# Patient Record
Sex: Female | Born: 1965
Health system: Southern US, Community
[De-identification: ages and names within clinical notes are randomized; demographics above are authoritative.]

## PROBLEM LIST (undated history)

## (undated) DIAGNOSIS — J189 Pneumonia, unspecified organism: Secondary | ICD-10-CM

## (undated) DIAGNOSIS — K219 Gastro-esophageal reflux disease without esophagitis: Secondary | ICD-10-CM

## (undated) DIAGNOSIS — I10 Essential (primary) hypertension: Secondary | ICD-10-CM

## (undated) DIAGNOSIS — E119 Type 2 diabetes mellitus without complications: Secondary | ICD-10-CM

## (undated) DIAGNOSIS — M1990 Unspecified osteoarthritis, unspecified site: Secondary | ICD-10-CM

## (undated) HISTORY — PX: ABDOMINAL HYSTERECTOMY: SHX81

## (undated) HISTORY — PX: TUBAL LIGATION: SHX77

## (undated) HISTORY — DX: Essential (primary) hypertension: I10

## (undated) HISTORY — PX: OTHER SURGICAL HISTORY: SHX169

---

## 2002-02-14 ENCOUNTER — Encounter: Payer: Self-pay | Admitting: Family Medicine

## 2002-02-14 ENCOUNTER — Ambulatory Visit: Admission: RE | Admit: 2002-02-14 | Discharge: 2002-02-15 | Payer: Self-pay | Admitting: Family Medicine

## 2002-06-18 ENCOUNTER — Other Ambulatory Visit: Admission: RE | Admit: 2002-06-18 | Discharge: 2002-06-18 | Payer: Self-pay | Admitting: Obstetrics and Gynecology

## 2003-08-24 ENCOUNTER — Other Ambulatory Visit: Admission: RE | Admit: 2003-08-24 | Discharge: 2003-08-24 | Payer: Self-pay | Admitting: Obstetrics and Gynecology

## 2003-11-17 ENCOUNTER — Ambulatory Visit (HOSPITAL_COMMUNITY): Admission: RE | Admit: 2003-11-17 | Discharge: 2003-11-17 | Payer: Self-pay | Admitting: Orthopedic Surgery

## 2003-11-17 ENCOUNTER — Ambulatory Visit (HOSPITAL_BASED_OUTPATIENT_CLINIC_OR_DEPARTMENT_OTHER): Admission: RE | Admit: 2003-11-17 | Discharge: 2003-11-17 | Payer: Self-pay | Admitting: Orthopedic Surgery

## 2005-01-10 ENCOUNTER — Other Ambulatory Visit: Admission: RE | Admit: 2005-01-10 | Discharge: 2005-01-10 | Payer: Self-pay | Admitting: Obstetrics and Gynecology

## 2005-03-09 ENCOUNTER — Ambulatory Visit (HOSPITAL_COMMUNITY): Admission: RE | Admit: 2005-03-09 | Discharge: 2005-03-09 | Payer: Self-pay | Admitting: Obstetrics and Gynecology

## 2014-09-27 ENCOUNTER — Encounter: Payer: Self-pay | Admitting: Podiatry

## 2014-09-27 ENCOUNTER — Ambulatory Visit (INDEPENDENT_AMBULATORY_CARE_PROVIDER_SITE_OTHER): Payer: Managed Care, Other (non HMO) | Admitting: Podiatry

## 2014-09-27 VITALS — BP 176/94 | HR 67 | Resp 14 | Ht 65.0 in | Wt 290.0 lb

## 2014-09-27 DIAGNOSIS — L600 Ingrowing nail: Secondary | ICD-10-CM

## 2014-09-27 NOTE — Progress Notes (Signed)
   Subjective:    Patient ID: Michelle Perry, female    DOB: [birth year], 48 y.o.   MRN: [id number]  HPI Comments: N ingrown toenail L left 1st medial toenail border D 2 months O 2nd episode C painful and encurvated A enclosed shoes and pressure T hx of TFC treatment many years ago     Review of Systems  All other systems reviewed and are negative.      Objective:   Physical Exam  Orientated 3  Vascular: DP pulses 2/4 bilaterally PT pulses 2/4 bilaterally Capillary reflex immediate bilaterally  Neurological: Ankle reflex equal and reactive bilaterally Sensation to 10 g monofilament wire intact 5/5 bilaterally  Dermatological: The medial margin of left hallux nail is incurvated with a callused nail groove and tender to palpation  Musculoskeletal HAV deformities bilaterally       Assessment & Plan:   Assessment: Satisfactory neurovascular status Ingrowing medial margin of the left hallux toenail  Plan: Offered patient permanent nail surgery and she verbally consents. The left hallux was blocked with 3 mL of 50-50 mixture of 2% plain Xylocaine and 0.5% plain Marcaine. Left hallux was painted with Betadine exsanguinated. The medial margin of left hallux toenail was excised and a phenol matricectomy foreign. An antibiotic compression dressing was applied. The tourniquet was released and spontaneous Late filling time noted in the left hallux.  Patient tolerated procedure without any difficulty. Postoperative oral and written instructions provided. Over-the-counter acetaminophen recommended for pain control if needed.  Reappoint at patient's request

## 2014-09-27 NOTE — Patient Instructions (Signed)
  Okay to use Tylenol for pain control if needed  ANTIBACTERIAL SOAP INSTRUCTIONS  THE DAY AFTER PROCEDURE  Please follow the instructions your doctor has marked.   Shower as usual. Before getting out, place a drop of antibacterial liquid soap (Dial) on a wet, clean washcloth.  Gently wipe washcloth over affected area.  Afterward, rinse the area with warm water.  Blot the area dry with a soft cloth and cover with antibiotic ointment (neosporin, polysporin, bacitracin) and band aid or gauze and tape  Place 3-4 drops of antibacterial liquid soap in a quart of warm tap water.  Submerge foot into water for 20 minutes.  If bandage was applied after your procedure, leave on to allow for easy lift off, then remove and continue with soak for the remaining time.  Next, blot area dry with a soft cloth and cover with a bandage.  Apply other medications as directed by your doctor, such as cortisporin otic solution (eardrops) or neosporin antibiotic ointment 

## 2014-09-28 ENCOUNTER — Encounter: Payer: Self-pay | Admitting: Podiatry

## 2015-05-09 ENCOUNTER — Encounter: Payer: Self-pay | Admitting: Podiatry

## 2015-05-09 ENCOUNTER — Ambulatory Visit (INDEPENDENT_AMBULATORY_CARE_PROVIDER_SITE_OTHER): Payer: BLUE CROSS/BLUE SHIELD | Admitting: Podiatry

## 2015-05-09 VITALS — BP 120/76 | HR 76 | Temp 98.5°F | Resp 15

## 2015-05-09 DIAGNOSIS — L600 Ingrowing nail: Secondary | ICD-10-CM | POA: Diagnosis not present

## 2015-05-09 NOTE — Patient Instructions (Signed)

## 2015-05-10 NOTE — Progress Notes (Signed)
Subjective:     Patient ID: Michelle Perry, female   DOB: 08/06/1966, 49 y.o.   MRN: [id number]  HPI patient points to left big toe stating that it's been hurting her for a few weeks and that there was that's no longer present but was there previously   Review of Systems     Objective:   Physical Exam Neurovascular status intact muscle strength adequate range of motion within normal limits and patient noted to have inflammation and discomfort medial border left hallux with incurvation of the nailbed noted    Assessment:     Paronychia infection left hallux medial border with ingrown toenail component that is been corrected previously but has had moderate regrowth of bed    Plan:     Reviewed condition and recommended removal of the corner be accomplished. Patient wants this done understanding surgery and risk and today I infiltrated the left hallux 60 mg Xylocaine Marcaine mixture removed the medial border exposed matrix and apply chemical phenol 3 applications 30 seconds followed by alcohol lavage and sterile dressing. Gave instructions on soaks

## 2015-10-27 ENCOUNTER — Ambulatory Visit (INDEPENDENT_AMBULATORY_CARE_PROVIDER_SITE_OTHER): Payer: BLUE CROSS/BLUE SHIELD | Admitting: Podiatry

## 2015-10-27 ENCOUNTER — Encounter: Payer: Self-pay | Admitting: Podiatry

## 2015-10-27 VITALS — BP 137/86 | HR 69 | Resp 12

## 2015-10-27 DIAGNOSIS — L600 Ingrowing nail: Secondary | ICD-10-CM

## 2015-10-27 NOTE — Patient Instructions (Signed)

## 2015-10-27 NOTE — Progress Notes (Signed)
Subjective:     Patient ID: Michelle Perry, female   DOB: 1966/04/08, 50 y.o.   MRN: [id number]  HPI patient presents with ingrown toenail the right big toe stating that it's happened several times but it is now was spicule but he gets irritated   Review of Systems     Objective:   Physical Exam Neurovascular status intact muscle strength adequate with patient having significant reduction of discomfort right hallux but lateral border shows a small incurvated spicule that's painful when pressed and makes shoe gear difficult    Assessment:     Ingrown toenail deformity right hallux with spicule formation    Plan:     Reviewed condition and recommended removal of the corner and explained surgery. At this time explained risk and the fact it still could recur she wants it done and is motivated and I infiltrated 60 [place] times like Marcaine mixture remove the corner exposed matrix and applied phenol 3 applications 30 seconds followed by alcohol lavage and sterile dressing. Instructed on soaks and reappoint

## 2015-11-02 ENCOUNTER — Telehealth: Payer: Self-pay | Admitting: *Deleted

## 2015-11-02 NOTE — Telephone Encounter (Signed)
Left message for patient at [phone] (Home #) to check to see how they were doing from their ingrown toenail procedure that was performed on Thursday, October 27, 2015. Waiting for a response.

## 2016-01-04 DIAGNOSIS — Z79899 Other long term (current) drug therapy: Secondary | ICD-10-CM | POA: Diagnosis not present

## 2016-01-04 DIAGNOSIS — Z23 Encounter for immunization: Secondary | ICD-10-CM | POA: Diagnosis not present

## 2016-01-04 DIAGNOSIS — E119 Type 2 diabetes mellitus without complications: Secondary | ICD-10-CM | POA: Diagnosis not present

## 2016-01-04 DIAGNOSIS — I10 Essential (primary) hypertension: Secondary | ICD-10-CM | POA: Diagnosis not present

## 2016-01-04 DIAGNOSIS — M7501 Adhesive capsulitis of right shoulder: Secondary | ICD-10-CM | POA: Diagnosis not present

## 2016-01-04 DIAGNOSIS — K219 Gastro-esophageal reflux disease without esophagitis: Secondary | ICD-10-CM | POA: Diagnosis not present

## 2016-01-04 DIAGNOSIS — E782 Mixed hyperlipidemia: Secondary | ICD-10-CM | POA: Diagnosis not present

## 2016-01-18 DIAGNOSIS — M75101 Unspecified rotator cuff tear or rupture of right shoulder, not specified as traumatic: Secondary | ICD-10-CM | POA: Diagnosis not present

## 2016-01-24 DIAGNOSIS — M25511 Pain in right shoulder: Secondary | ICD-10-CM | POA: Diagnosis not present

## 2016-02-08 DIAGNOSIS — R531 Weakness: Secondary | ICD-10-CM | POA: Diagnosis not present

## 2016-02-08 DIAGNOSIS — M25511 Pain in right shoulder: Secondary | ICD-10-CM | POA: Diagnosis not present

## 2016-02-08 DIAGNOSIS — M7541 Impingement syndrome of right shoulder: Secondary | ICD-10-CM | POA: Diagnosis not present

## 2016-02-10 DIAGNOSIS — M7541 Impingement syndrome of right shoulder: Secondary | ICD-10-CM | POA: Diagnosis not present

## 2016-02-10 DIAGNOSIS — M25511 Pain in right shoulder: Secondary | ICD-10-CM | POA: Diagnosis not present

## 2016-02-10 DIAGNOSIS — R531 Weakness: Secondary | ICD-10-CM | POA: Diagnosis not present

## 2016-02-13 DIAGNOSIS — M25511 Pain in right shoulder: Secondary | ICD-10-CM | POA: Diagnosis not present

## 2016-02-13 DIAGNOSIS — R531 Weakness: Secondary | ICD-10-CM | POA: Diagnosis not present

## 2016-02-13 DIAGNOSIS — M7541 Impingement syndrome of right shoulder: Secondary | ICD-10-CM | POA: Diagnosis not present

## 2016-02-15 DIAGNOSIS — M25511 Pain in right shoulder: Secondary | ICD-10-CM | POA: Diagnosis not present

## 2016-02-15 DIAGNOSIS — R531 Weakness: Secondary | ICD-10-CM | POA: Diagnosis not present

## 2016-02-15 DIAGNOSIS — M7541 Impingement syndrome of right shoulder: Secondary | ICD-10-CM | POA: Diagnosis not present

## 2016-02-17 DIAGNOSIS — M7541 Impingement syndrome of right shoulder: Secondary | ICD-10-CM | POA: Diagnosis not present

## 2016-02-17 DIAGNOSIS — M25511 Pain in right shoulder: Secondary | ICD-10-CM | POA: Diagnosis not present

## 2016-02-17 DIAGNOSIS — R531 Weakness: Secondary | ICD-10-CM | POA: Diagnosis not present

## 2016-02-20 DIAGNOSIS — M25511 Pain in right shoulder: Secondary | ICD-10-CM | POA: Diagnosis not present

## 2016-02-20 DIAGNOSIS — R531 Weakness: Secondary | ICD-10-CM | POA: Diagnosis not present

## 2016-02-20 DIAGNOSIS — M7541 Impingement syndrome of right shoulder: Secondary | ICD-10-CM | POA: Diagnosis not present

## 2016-02-22 DIAGNOSIS — M7541 Impingement syndrome of right shoulder: Secondary | ICD-10-CM | POA: Diagnosis not present

## 2016-02-22 DIAGNOSIS — M25511 Pain in right shoulder: Secondary | ICD-10-CM | POA: Diagnosis not present

## 2016-02-22 DIAGNOSIS — R531 Weakness: Secondary | ICD-10-CM | POA: Diagnosis not present

## 2016-02-29 DIAGNOSIS — R531 Weakness: Secondary | ICD-10-CM | POA: Diagnosis not present

## 2016-02-29 DIAGNOSIS — M25511 Pain in right shoulder: Secondary | ICD-10-CM | POA: Diagnosis not present

## 2016-02-29 DIAGNOSIS — M7541 Impingement syndrome of right shoulder: Secondary | ICD-10-CM | POA: Diagnosis not present

## 2016-03-02 DIAGNOSIS — R531 Weakness: Secondary | ICD-10-CM | POA: Diagnosis not present

## 2016-03-02 DIAGNOSIS — M7541 Impingement syndrome of right shoulder: Secondary | ICD-10-CM | POA: Diagnosis not present

## 2016-03-02 DIAGNOSIS — M25511 Pain in right shoulder: Secondary | ICD-10-CM | POA: Diagnosis not present

## 2016-03-05 DIAGNOSIS — M7541 Impingement syndrome of right shoulder: Secondary | ICD-10-CM | POA: Diagnosis not present

## 2016-03-05 DIAGNOSIS — M25511 Pain in right shoulder: Secondary | ICD-10-CM | POA: Diagnosis not present

## 2016-03-05 DIAGNOSIS — R531 Weakness: Secondary | ICD-10-CM | POA: Diagnosis not present

## 2016-03-07 DIAGNOSIS — M25511 Pain in right shoulder: Secondary | ICD-10-CM | POA: Diagnosis not present

## 2016-03-07 DIAGNOSIS — M7541 Impingement syndrome of right shoulder: Secondary | ICD-10-CM | POA: Diagnosis not present

## 2016-03-07 DIAGNOSIS — R531 Weakness: Secondary | ICD-10-CM | POA: Diagnosis not present

## 2016-03-09 DIAGNOSIS — M7541 Impingement syndrome of right shoulder: Secondary | ICD-10-CM | POA: Diagnosis not present

## 2016-03-12 DIAGNOSIS — R531 Weakness: Secondary | ICD-10-CM | POA: Diagnosis not present

## 2016-03-12 DIAGNOSIS — M25511 Pain in right shoulder: Secondary | ICD-10-CM | POA: Diagnosis not present

## 2016-03-12 DIAGNOSIS — M7541 Impingement syndrome of right shoulder: Secondary | ICD-10-CM | POA: Diagnosis not present

## 2016-03-14 DIAGNOSIS — M25511 Pain in right shoulder: Secondary | ICD-10-CM | POA: Diagnosis not present

## 2016-03-14 DIAGNOSIS — R531 Weakness: Secondary | ICD-10-CM | POA: Diagnosis not present

## 2016-03-14 DIAGNOSIS — M7541 Impingement syndrome of right shoulder: Secondary | ICD-10-CM | POA: Diagnosis not present

## 2016-03-16 DIAGNOSIS — M7541 Impingement syndrome of right shoulder: Secondary | ICD-10-CM | POA: Diagnosis not present

## 2016-03-16 DIAGNOSIS — M25511 Pain in right shoulder: Secondary | ICD-10-CM | POA: Diagnosis not present

## 2016-03-16 DIAGNOSIS — R531 Weakness: Secondary | ICD-10-CM | POA: Diagnosis not present

## 2016-03-19 DIAGNOSIS — M7541 Impingement syndrome of right shoulder: Secondary | ICD-10-CM | POA: Diagnosis not present

## 2016-03-19 DIAGNOSIS — M25511 Pain in right shoulder: Secondary | ICD-10-CM | POA: Diagnosis not present

## 2016-03-19 DIAGNOSIS — R531 Weakness: Secondary | ICD-10-CM | POA: Diagnosis not present

## 2016-03-21 DIAGNOSIS — M7541 Impingement syndrome of right shoulder: Secondary | ICD-10-CM | POA: Diagnosis not present

## 2016-03-21 DIAGNOSIS — M25511 Pain in right shoulder: Secondary | ICD-10-CM | POA: Diagnosis not present

## 2016-03-21 DIAGNOSIS — R531 Weakness: Secondary | ICD-10-CM | POA: Diagnosis not present

## 2016-03-23 DIAGNOSIS — M7541 Impingement syndrome of right shoulder: Secondary | ICD-10-CM | POA: Diagnosis not present

## 2016-03-23 DIAGNOSIS — R531 Weakness: Secondary | ICD-10-CM | POA: Diagnosis not present

## 2016-03-23 DIAGNOSIS — M25511 Pain in right shoulder: Secondary | ICD-10-CM | POA: Diagnosis not present

## 2016-03-26 DIAGNOSIS — M25511 Pain in right shoulder: Secondary | ICD-10-CM | POA: Diagnosis not present

## 2016-03-26 DIAGNOSIS — R531 Weakness: Secondary | ICD-10-CM | POA: Diagnosis not present

## 2016-03-26 DIAGNOSIS — M7541 Impingement syndrome of right shoulder: Secondary | ICD-10-CM | POA: Diagnosis not present

## 2016-03-28 DIAGNOSIS — R531 Weakness: Secondary | ICD-10-CM | POA: Diagnosis not present

## 2016-03-28 DIAGNOSIS — M7541 Impingement syndrome of right shoulder: Secondary | ICD-10-CM | POA: Diagnosis not present

## 2016-03-28 DIAGNOSIS — M25511 Pain in right shoulder: Secondary | ICD-10-CM | POA: Diagnosis not present

## 2016-03-30 DIAGNOSIS — M7541 Impingement syndrome of right shoulder: Secondary | ICD-10-CM | POA: Diagnosis not present

## 2016-03-30 DIAGNOSIS — M25511 Pain in right shoulder: Secondary | ICD-10-CM | POA: Diagnosis not present

## 2016-03-30 DIAGNOSIS — R531 Weakness: Secondary | ICD-10-CM | POA: Diagnosis not present

## 2016-04-02 DIAGNOSIS — R531 Weakness: Secondary | ICD-10-CM | POA: Diagnosis not present

## 2016-04-02 DIAGNOSIS — M25511 Pain in right shoulder: Secondary | ICD-10-CM | POA: Diagnosis not present

## 2016-04-02 DIAGNOSIS — M7541 Impingement syndrome of right shoulder: Secondary | ICD-10-CM | POA: Diagnosis not present

## 2016-04-04 DIAGNOSIS — M25511 Pain in right shoulder: Secondary | ICD-10-CM | POA: Diagnosis not present

## 2016-04-04 DIAGNOSIS — M7541 Impingement syndrome of right shoulder: Secondary | ICD-10-CM | POA: Diagnosis not present

## 2016-04-04 DIAGNOSIS — R531 Weakness: Secondary | ICD-10-CM | POA: Diagnosis not present

## 2016-04-09 DIAGNOSIS — M7541 Impingement syndrome of right shoulder: Secondary | ICD-10-CM | POA: Diagnosis not present

## 2016-04-09 DIAGNOSIS — R531 Weakness: Secondary | ICD-10-CM | POA: Diagnosis not present

## 2016-04-09 DIAGNOSIS — M25511 Pain in right shoulder: Secondary | ICD-10-CM | POA: Diagnosis not present

## 2016-05-02 DIAGNOSIS — M75111 Incomplete rotator cuff tear or rupture of right shoulder, not specified as traumatic: Secondary | ICD-10-CM | POA: Diagnosis not present

## 2016-05-02 DIAGNOSIS — M19011 Primary osteoarthritis, right shoulder: Secondary | ICD-10-CM | POA: Diagnosis not present

## 2016-05-02 DIAGNOSIS — M24111 Other articular cartilage disorders, right shoulder: Secondary | ICD-10-CM | POA: Diagnosis not present

## 2016-05-02 DIAGNOSIS — M7541 Impingement syndrome of right shoulder: Secondary | ICD-10-CM | POA: Diagnosis not present

## 2016-05-02 DIAGNOSIS — M7551 Bursitis of right shoulder: Secondary | ICD-10-CM | POA: Diagnosis not present

## 2016-05-02 DIAGNOSIS — G8918 Other acute postprocedural pain: Secondary | ICD-10-CM | POA: Diagnosis not present

## 2016-05-07 DIAGNOSIS — R531 Weakness: Secondary | ICD-10-CM | POA: Diagnosis not present

## 2016-05-07 DIAGNOSIS — M25611 Stiffness of right shoulder, not elsewhere classified: Secondary | ICD-10-CM | POA: Diagnosis not present

## 2016-05-07 DIAGNOSIS — M25511 Pain in right shoulder: Secondary | ICD-10-CM | POA: Diagnosis not present

## 2016-05-07 DIAGNOSIS — M75111 Incomplete rotator cuff tear or rupture of right shoulder, not specified as traumatic: Secondary | ICD-10-CM | POA: Diagnosis not present

## 2016-05-08 DIAGNOSIS — M25511 Pain in right shoulder: Secondary | ICD-10-CM | POA: Diagnosis not present

## 2016-05-09 DIAGNOSIS — E119 Type 2 diabetes mellitus without complications: Secondary | ICD-10-CM | POA: Diagnosis not present

## 2016-05-09 DIAGNOSIS — E782 Mixed hyperlipidemia: Secondary | ICD-10-CM | POA: Diagnosis not present

## 2016-05-09 DIAGNOSIS — K219 Gastro-esophageal reflux disease without esophagitis: Secondary | ICD-10-CM | POA: Diagnosis not present

## 2016-05-09 DIAGNOSIS — I10 Essential (primary) hypertension: Secondary | ICD-10-CM | POA: Diagnosis not present

## 2016-05-11 DIAGNOSIS — M75111 Incomplete rotator cuff tear or rupture of right shoulder, not specified as traumatic: Secondary | ICD-10-CM | POA: Diagnosis not present

## 2016-05-11 DIAGNOSIS — R531 Weakness: Secondary | ICD-10-CM | POA: Diagnosis not present

## 2016-05-11 DIAGNOSIS — M25611 Stiffness of right shoulder, not elsewhere classified: Secondary | ICD-10-CM | POA: Diagnosis not present

## 2016-05-11 DIAGNOSIS — M25511 Pain in right shoulder: Secondary | ICD-10-CM | POA: Diagnosis not present

## 2016-05-14 DIAGNOSIS — M25611 Stiffness of right shoulder, not elsewhere classified: Secondary | ICD-10-CM | POA: Diagnosis not present

## 2016-05-14 DIAGNOSIS — M25511 Pain in right shoulder: Secondary | ICD-10-CM | POA: Diagnosis not present

## 2016-05-14 DIAGNOSIS — R531 Weakness: Secondary | ICD-10-CM | POA: Diagnosis not present

## 2016-05-14 DIAGNOSIS — M75111 Incomplete rotator cuff tear or rupture of right shoulder, not specified as traumatic: Secondary | ICD-10-CM | POA: Diagnosis not present

## 2016-05-17 DIAGNOSIS — R531 Weakness: Secondary | ICD-10-CM | POA: Diagnosis not present

## 2016-05-17 DIAGNOSIS — M25511 Pain in right shoulder: Secondary | ICD-10-CM | POA: Diagnosis not present

## 2016-05-17 DIAGNOSIS — M25611 Stiffness of right shoulder, not elsewhere classified: Secondary | ICD-10-CM | POA: Diagnosis not present

## 2016-05-17 DIAGNOSIS — M75111 Incomplete rotator cuff tear or rupture of right shoulder, not specified as traumatic: Secondary | ICD-10-CM | POA: Diagnosis not present

## 2016-05-21 DIAGNOSIS — J039 Acute tonsillitis, unspecified: Secondary | ICD-10-CM | POA: Diagnosis not present

## 2016-05-21 DIAGNOSIS — Z6841 Body Mass Index (BMI) 40.0 and over, adult: Secondary | ICD-10-CM | POA: Diagnosis not present

## 2016-05-25 DIAGNOSIS — M75111 Incomplete rotator cuff tear or rupture of right shoulder, not specified as traumatic: Secondary | ICD-10-CM | POA: Diagnosis not present

## 2016-05-25 DIAGNOSIS — M25511 Pain in right shoulder: Secondary | ICD-10-CM | POA: Diagnosis not present

## 2016-05-25 DIAGNOSIS — R531 Weakness: Secondary | ICD-10-CM | POA: Diagnosis not present

## 2016-05-25 DIAGNOSIS — M25611 Stiffness of right shoulder, not elsewhere classified: Secondary | ICD-10-CM | POA: Diagnosis not present

## 2016-05-28 DIAGNOSIS — M75111 Incomplete rotator cuff tear or rupture of right shoulder, not specified as traumatic: Secondary | ICD-10-CM | POA: Diagnosis not present

## 2016-05-28 DIAGNOSIS — R531 Weakness: Secondary | ICD-10-CM | POA: Diagnosis not present

## 2016-05-28 DIAGNOSIS — M25511 Pain in right shoulder: Secondary | ICD-10-CM | POA: Diagnosis not present

## 2016-05-28 DIAGNOSIS — M25611 Stiffness of right shoulder, not elsewhere classified: Secondary | ICD-10-CM | POA: Diagnosis not present

## 2016-05-30 DIAGNOSIS — R531 Weakness: Secondary | ICD-10-CM | POA: Diagnosis not present

## 2016-05-30 DIAGNOSIS — M75111 Incomplete rotator cuff tear or rupture of right shoulder, not specified as traumatic: Secondary | ICD-10-CM | POA: Diagnosis not present

## 2016-05-30 DIAGNOSIS — M25511 Pain in right shoulder: Secondary | ICD-10-CM | POA: Diagnosis not present

## 2016-05-30 DIAGNOSIS — M25611 Stiffness of right shoulder, not elsewhere classified: Secondary | ICD-10-CM | POA: Diagnosis not present

## 2016-06-05 DIAGNOSIS — M25611 Stiffness of right shoulder, not elsewhere classified: Secondary | ICD-10-CM | POA: Diagnosis not present

## 2016-06-05 DIAGNOSIS — M25511 Pain in right shoulder: Secondary | ICD-10-CM | POA: Diagnosis not present

## 2016-06-05 DIAGNOSIS — M7541 Impingement syndrome of right shoulder: Secondary | ICD-10-CM | POA: Diagnosis not present

## 2016-06-05 DIAGNOSIS — M75111 Incomplete rotator cuff tear or rupture of right shoulder, not specified as traumatic: Secondary | ICD-10-CM | POA: Diagnosis not present

## 2016-06-08 DIAGNOSIS — R531 Weakness: Secondary | ICD-10-CM | POA: Diagnosis not present

## 2016-06-08 DIAGNOSIS — M75111 Incomplete rotator cuff tear or rupture of right shoulder, not specified as traumatic: Secondary | ICD-10-CM | POA: Diagnosis not present

## 2016-06-08 DIAGNOSIS — M25511 Pain in right shoulder: Secondary | ICD-10-CM | POA: Diagnosis not present

## 2016-06-08 DIAGNOSIS — M25611 Stiffness of right shoulder, not elsewhere classified: Secondary | ICD-10-CM | POA: Diagnosis not present

## 2016-06-11 DIAGNOSIS — M25611 Stiffness of right shoulder, not elsewhere classified: Secondary | ICD-10-CM | POA: Diagnosis not present

## 2016-06-11 DIAGNOSIS — M75111 Incomplete rotator cuff tear or rupture of right shoulder, not specified as traumatic: Secondary | ICD-10-CM | POA: Diagnosis not present

## 2016-06-11 DIAGNOSIS — M25511 Pain in right shoulder: Secondary | ICD-10-CM | POA: Diagnosis not present

## 2016-06-11 DIAGNOSIS — R531 Weakness: Secondary | ICD-10-CM | POA: Diagnosis not present

## 2016-06-12 DIAGNOSIS — Z1231 Encounter for screening mammogram for malignant neoplasm of breast: Secondary | ICD-10-CM | POA: Diagnosis not present

## 2016-06-15 DIAGNOSIS — R531 Weakness: Secondary | ICD-10-CM | POA: Diagnosis not present

## 2016-06-15 DIAGNOSIS — M25611 Stiffness of right shoulder, not elsewhere classified: Secondary | ICD-10-CM | POA: Diagnosis not present

## 2016-06-15 DIAGNOSIS — M25511 Pain in right shoulder: Secondary | ICD-10-CM | POA: Diagnosis not present

## 2016-06-15 DIAGNOSIS — M75111 Incomplete rotator cuff tear or rupture of right shoulder, not specified as traumatic: Secondary | ICD-10-CM | POA: Diagnosis not present

## 2016-06-18 DIAGNOSIS — M25511 Pain in right shoulder: Secondary | ICD-10-CM | POA: Diagnosis not present

## 2016-06-18 DIAGNOSIS — R531 Weakness: Secondary | ICD-10-CM | POA: Diagnosis not present

## 2016-06-18 DIAGNOSIS — M25611 Stiffness of right shoulder, not elsewhere classified: Secondary | ICD-10-CM | POA: Diagnosis not present

## 2016-06-18 DIAGNOSIS — M75111 Incomplete rotator cuff tear or rupture of right shoulder, not specified as traumatic: Secondary | ICD-10-CM | POA: Diagnosis not present

## 2016-06-19 DIAGNOSIS — M25511 Pain in right shoulder: Secondary | ICD-10-CM | POA: Diagnosis not present

## 2016-06-19 DIAGNOSIS — M75111 Incomplete rotator cuff tear or rupture of right shoulder, not specified as traumatic: Secondary | ICD-10-CM | POA: Diagnosis not present

## 2016-06-19 DIAGNOSIS — M25611 Stiffness of right shoulder, not elsewhere classified: Secondary | ICD-10-CM | POA: Diagnosis not present

## 2016-06-19 DIAGNOSIS — R531 Weakness: Secondary | ICD-10-CM | POA: Diagnosis not present

## 2016-06-26 DIAGNOSIS — M25611 Stiffness of right shoulder, not elsewhere classified: Secondary | ICD-10-CM | POA: Diagnosis not present

## 2016-06-26 DIAGNOSIS — R531 Weakness: Secondary | ICD-10-CM | POA: Diagnosis not present

## 2016-06-26 DIAGNOSIS — M75111 Incomplete rotator cuff tear or rupture of right shoulder, not specified as traumatic: Secondary | ICD-10-CM | POA: Diagnosis not present

## 2016-06-26 DIAGNOSIS — M25511 Pain in right shoulder: Secondary | ICD-10-CM | POA: Diagnosis not present

## 2016-06-29 DIAGNOSIS — M25611 Stiffness of right shoulder, not elsewhere classified: Secondary | ICD-10-CM | POA: Diagnosis not present

## 2016-06-29 DIAGNOSIS — R531 Weakness: Secondary | ICD-10-CM | POA: Diagnosis not present

## 2016-06-29 DIAGNOSIS — M25511 Pain in right shoulder: Secondary | ICD-10-CM | POA: Diagnosis not present

## 2016-06-29 DIAGNOSIS — M75111 Incomplete rotator cuff tear or rupture of right shoulder, not specified as traumatic: Secondary | ICD-10-CM | POA: Diagnosis not present

## 2016-07-03 DIAGNOSIS — M25611 Stiffness of right shoulder, not elsewhere classified: Secondary | ICD-10-CM | POA: Diagnosis not present

## 2016-07-03 DIAGNOSIS — M75111 Incomplete rotator cuff tear or rupture of right shoulder, not specified as traumatic: Secondary | ICD-10-CM | POA: Diagnosis not present

## 2016-07-03 DIAGNOSIS — M25511 Pain in right shoulder: Secondary | ICD-10-CM | POA: Diagnosis not present

## 2016-07-03 DIAGNOSIS — R531 Weakness: Secondary | ICD-10-CM | POA: Diagnosis not present

## 2016-07-06 DIAGNOSIS — M25511 Pain in right shoulder: Secondary | ICD-10-CM | POA: Diagnosis not present

## 2016-07-06 DIAGNOSIS — R531 Weakness: Secondary | ICD-10-CM | POA: Diagnosis not present

## 2016-07-06 DIAGNOSIS — M25611 Stiffness of right shoulder, not elsewhere classified: Secondary | ICD-10-CM | POA: Diagnosis not present

## 2016-07-06 DIAGNOSIS — M75111 Incomplete rotator cuff tear or rupture of right shoulder, not specified as traumatic: Secondary | ICD-10-CM | POA: Diagnosis not present

## 2016-07-09 DIAGNOSIS — R531 Weakness: Secondary | ICD-10-CM | POA: Diagnosis not present

## 2016-07-09 DIAGNOSIS — M25611 Stiffness of right shoulder, not elsewhere classified: Secondary | ICD-10-CM | POA: Diagnosis not present

## 2016-07-09 DIAGNOSIS — M75111 Incomplete rotator cuff tear or rupture of right shoulder, not specified as traumatic: Secondary | ICD-10-CM | POA: Diagnosis not present

## 2016-07-09 DIAGNOSIS — M25511 Pain in right shoulder: Secondary | ICD-10-CM | POA: Diagnosis not present

## 2016-07-10 DIAGNOSIS — M25611 Stiffness of right shoulder, not elsewhere classified: Secondary | ICD-10-CM | POA: Diagnosis not present

## 2016-07-10 DIAGNOSIS — R531 Weakness: Secondary | ICD-10-CM | POA: Diagnosis not present

## 2016-07-10 DIAGNOSIS — M75111 Incomplete rotator cuff tear or rupture of right shoulder, not specified as traumatic: Secondary | ICD-10-CM | POA: Diagnosis not present

## 2016-07-10 DIAGNOSIS — M25511 Pain in right shoulder: Secondary | ICD-10-CM | POA: Diagnosis not present

## 2016-07-23 DIAGNOSIS — M75111 Incomplete rotator cuff tear or rupture of right shoulder, not specified as traumatic: Secondary | ICD-10-CM | POA: Diagnosis not present

## 2016-07-23 DIAGNOSIS — M25611 Stiffness of right shoulder, not elsewhere classified: Secondary | ICD-10-CM | POA: Diagnosis not present

## 2016-07-23 DIAGNOSIS — R531 Weakness: Secondary | ICD-10-CM | POA: Diagnosis not present

## 2016-07-23 DIAGNOSIS — M25511 Pain in right shoulder: Secondary | ICD-10-CM | POA: Diagnosis not present

## 2016-07-25 DIAGNOSIS — R531 Weakness: Secondary | ICD-10-CM | POA: Diagnosis not present

## 2016-07-25 DIAGNOSIS — M75111 Incomplete rotator cuff tear or rupture of right shoulder, not specified as traumatic: Secondary | ICD-10-CM | POA: Diagnosis not present

## 2016-07-25 DIAGNOSIS — M25511 Pain in right shoulder: Secondary | ICD-10-CM | POA: Diagnosis not present

## 2016-07-25 DIAGNOSIS — M25611 Stiffness of right shoulder, not elsewhere classified: Secondary | ICD-10-CM | POA: Diagnosis not present

## 2016-07-27 DIAGNOSIS — R531 Weakness: Secondary | ICD-10-CM | POA: Diagnosis not present

## 2016-07-27 DIAGNOSIS — M25511 Pain in right shoulder: Secondary | ICD-10-CM | POA: Diagnosis not present

## 2016-07-27 DIAGNOSIS — M75111 Incomplete rotator cuff tear or rupture of right shoulder, not specified as traumatic: Secondary | ICD-10-CM | POA: Diagnosis not present

## 2016-07-27 DIAGNOSIS — M25611 Stiffness of right shoulder, not elsewhere classified: Secondary | ICD-10-CM | POA: Diagnosis not present

## 2016-07-30 DIAGNOSIS — M25611 Stiffness of right shoulder, not elsewhere classified: Secondary | ICD-10-CM | POA: Diagnosis not present

## 2016-07-30 DIAGNOSIS — M25511 Pain in right shoulder: Secondary | ICD-10-CM | POA: Diagnosis not present

## 2016-07-30 DIAGNOSIS — M75111 Incomplete rotator cuff tear or rupture of right shoulder, not specified as traumatic: Secondary | ICD-10-CM | POA: Diagnosis not present

## 2016-07-30 DIAGNOSIS — R531 Weakness: Secondary | ICD-10-CM | POA: Diagnosis not present

## 2016-08-13 DIAGNOSIS — M25511 Pain in right shoulder: Secondary | ICD-10-CM | POA: Diagnosis not present

## 2016-09-06 DIAGNOSIS — M1712 Unilateral primary osteoarthritis, left knee: Secondary | ICD-10-CM | POA: Diagnosis not present

## 2016-09-12 DIAGNOSIS — M1712 Unilateral primary osteoarthritis, left knee: Secondary | ICD-10-CM | POA: Diagnosis not present

## 2016-09-19 DIAGNOSIS — I10 Essential (primary) hypertension: Secondary | ICD-10-CM | POA: Diagnosis not present

## 2016-09-19 DIAGNOSIS — Z1389 Encounter for screening for other disorder: Secondary | ICD-10-CM | POA: Diagnosis not present

## 2016-09-19 DIAGNOSIS — Z6841 Body Mass Index (BMI) 40.0 and over, adult: Secondary | ICD-10-CM | POA: Diagnosis not present

## 2016-09-19 DIAGNOSIS — E782 Mixed hyperlipidemia: Secondary | ICD-10-CM | POA: Diagnosis not present

## 2016-09-19 DIAGNOSIS — E119 Type 2 diabetes mellitus without complications: Secondary | ICD-10-CM | POA: Diagnosis not present

## 2016-09-20 DIAGNOSIS — M1712 Unilateral primary osteoarthritis, left knee: Secondary | ICD-10-CM | POA: Diagnosis not present

## 2016-10-02 DIAGNOSIS — M1712 Unilateral primary osteoarthritis, left knee: Secondary | ICD-10-CM | POA: Diagnosis not present

## 2016-10-08 DIAGNOSIS — Z6841 Body Mass Index (BMI) 40.0 and over, adult: Secondary | ICD-10-CM | POA: Diagnosis not present

## 2016-10-08 DIAGNOSIS — J0110 Acute frontal sinusitis, unspecified: Secondary | ICD-10-CM | POA: Diagnosis not present

## 2017-01-07 DIAGNOSIS — Z6841 Body Mass Index (BMI) 40.0 and over, adult: Secondary | ICD-10-CM | POA: Diagnosis not present

## 2017-01-07 DIAGNOSIS — L989 Disorder of the skin and subcutaneous tissue, unspecified: Secondary | ICD-10-CM | POA: Diagnosis not present

## 2017-01-15 DIAGNOSIS — R222 Localized swelling, mass and lump, trunk: Secondary | ICD-10-CM | POA: Diagnosis not present

## 2017-01-15 DIAGNOSIS — L989 Disorder of the skin and subcutaneous tissue, unspecified: Secondary | ICD-10-CM | POA: Diagnosis not present

## 2017-02-15 DIAGNOSIS — J019 Acute sinusitis, unspecified: Secondary | ICD-10-CM | POA: Diagnosis not present

## 2017-02-15 DIAGNOSIS — Z6841 Body Mass Index (BMI) 40.0 and over, adult: Secondary | ICD-10-CM | POA: Diagnosis not present

## 2017-03-08 DIAGNOSIS — Z6841 Body Mass Index (BMI) 40.0 and over, adult: Secondary | ICD-10-CM | POA: Diagnosis not present

## 2017-03-08 DIAGNOSIS — E119 Type 2 diabetes mellitus without complications: Secondary | ICD-10-CM | POA: Diagnosis not present

## 2017-03-08 DIAGNOSIS — L259 Unspecified contact dermatitis, unspecified cause: Secondary | ICD-10-CM | POA: Diagnosis not present

## 2017-03-22 DIAGNOSIS — E119 Type 2 diabetes mellitus without complications: Secondary | ICD-10-CM | POA: Diagnosis not present

## 2017-03-22 DIAGNOSIS — E782 Mixed hyperlipidemia: Secondary | ICD-10-CM | POA: Diagnosis not present

## 2017-03-22 DIAGNOSIS — K219 Gastro-esophageal reflux disease without esophagitis: Secondary | ICD-10-CM | POA: Diagnosis not present

## 2017-03-22 DIAGNOSIS — I10 Essential (primary) hypertension: Secondary | ICD-10-CM | POA: Diagnosis not present

## 2017-05-30 DIAGNOSIS — R42 Dizziness and giddiness: Secondary | ICD-10-CM | POA: Diagnosis not present

## 2017-05-30 DIAGNOSIS — Z6841 Body Mass Index (BMI) 40.0 and over, adult: Secondary | ICD-10-CM | POA: Diagnosis not present

## 2017-08-12 DIAGNOSIS — H5213 Myopia, bilateral: Secondary | ICD-10-CM | POA: Diagnosis not present

## 2017-08-12 DIAGNOSIS — E119 Type 2 diabetes mellitus without complications: Secondary | ICD-10-CM | POA: Diagnosis not present

## 2017-09-11 DIAGNOSIS — M1712 Unilateral primary osteoarthritis, left knee: Secondary | ICD-10-CM | POA: Diagnosis not present

## 2017-09-18 DIAGNOSIS — M1712 Unilateral primary osteoarthritis, left knee: Secondary | ICD-10-CM | POA: Diagnosis not present

## 2017-10-02 DIAGNOSIS — M1712 Unilateral primary osteoarthritis, left knee: Secondary | ICD-10-CM | POA: Diagnosis not present

## 2017-10-03 DIAGNOSIS — E782 Mixed hyperlipidemia: Secondary | ICD-10-CM | POA: Diagnosis not present

## 2017-10-03 DIAGNOSIS — Z79899 Other long term (current) drug therapy: Secondary | ICD-10-CM | POA: Diagnosis not present

## 2017-10-03 DIAGNOSIS — I10 Essential (primary) hypertension: Secondary | ICD-10-CM | POA: Diagnosis not present

## 2017-10-03 DIAGNOSIS — M1712 Unilateral primary osteoarthritis, left knee: Secondary | ICD-10-CM | POA: Diagnosis not present

## 2017-10-03 DIAGNOSIS — E119 Type 2 diabetes mellitus without complications: Secondary | ICD-10-CM | POA: Diagnosis not present

## 2017-10-03 DIAGNOSIS — N951 Menopausal and female climacteric states: Secondary | ICD-10-CM | POA: Diagnosis not present

## 2017-10-09 DIAGNOSIS — D225 Melanocytic nevi of trunk: Secondary | ICD-10-CM | POA: Diagnosis not present

## 2017-10-09 DIAGNOSIS — D1801 Hemangioma of skin and subcutaneous tissue: Secondary | ICD-10-CM | POA: Diagnosis not present

## 2017-10-09 DIAGNOSIS — L821 Other seborrheic keratosis: Secondary | ICD-10-CM | POA: Diagnosis not present

## 2017-10-09 DIAGNOSIS — L814 Other melanin hyperpigmentation: Secondary | ICD-10-CM | POA: Diagnosis not present

## 2017-10-22 DIAGNOSIS — Z1231 Encounter for screening mammogram for malignant neoplasm of breast: Secondary | ICD-10-CM | POA: Diagnosis not present

## 2017-11-18 DIAGNOSIS — Z6841 Body Mass Index (BMI) 40.0 and over, adult: Secondary | ICD-10-CM | POA: Diagnosis not present

## 2017-11-18 DIAGNOSIS — J019 Acute sinusitis, unspecified: Secondary | ICD-10-CM | POA: Diagnosis not present

## 2017-12-11 ENCOUNTER — Ambulatory Visit: Payer: BLUE CROSS/BLUE SHIELD | Admitting: Podiatry

## 2017-12-19 ENCOUNTER — Ambulatory Visit: Payer: BLUE CROSS/BLUE SHIELD | Admitting: Podiatry

## 2018-04-28 DIAGNOSIS — I10 Essential (primary) hypertension: Secondary | ICD-10-CM | POA: Diagnosis not present

## 2018-04-28 DIAGNOSIS — K219 Gastro-esophageal reflux disease without esophagitis: Secondary | ICD-10-CM | POA: Diagnosis not present

## 2018-04-28 DIAGNOSIS — Z1331 Encounter for screening for depression: Secondary | ICD-10-CM | POA: Diagnosis not present

## 2018-04-28 DIAGNOSIS — E119 Type 2 diabetes mellitus without complications: Secondary | ICD-10-CM | POA: Diagnosis not present

## 2018-04-28 DIAGNOSIS — E782 Mixed hyperlipidemia: Secondary | ICD-10-CM | POA: Diagnosis not present

## 2018-05-26 DIAGNOSIS — J029 Acute pharyngitis, unspecified: Secondary | ICD-10-CM | POA: Diagnosis not present

## 2018-07-29 DIAGNOSIS — K219 Gastro-esophageal reflux disease without esophagitis: Secondary | ICD-10-CM | POA: Diagnosis not present

## 2018-07-29 DIAGNOSIS — I10 Essential (primary) hypertension: Secondary | ICD-10-CM | POA: Diagnosis not present

## 2018-07-29 DIAGNOSIS — E119 Type 2 diabetes mellitus without complications: Secondary | ICD-10-CM | POA: Diagnosis not present

## 2018-07-29 DIAGNOSIS — Z1331 Encounter for screening for depression: Secondary | ICD-10-CM | POA: Diagnosis not present

## 2018-07-29 DIAGNOSIS — Z23 Encounter for immunization: Secondary | ICD-10-CM | POA: Diagnosis not present

## 2018-07-29 DIAGNOSIS — M1712 Unilateral primary osteoarthritis, left knee: Secondary | ICD-10-CM | POA: Diagnosis not present

## 2018-09-01 ENCOUNTER — Ambulatory Visit (INDEPENDENT_AMBULATORY_CARE_PROVIDER_SITE_OTHER): Payer: BLUE CROSS/BLUE SHIELD | Admitting: Podiatry

## 2018-09-01 ENCOUNTER — Encounter: Payer: Self-pay | Admitting: Podiatry

## 2018-09-01 VITALS — BP 129/82 | HR 79

## 2018-09-01 DIAGNOSIS — M1712 Unilateral primary osteoarthritis, left knee: Secondary | ICD-10-CM | POA: Diagnosis not present

## 2018-09-01 DIAGNOSIS — L600 Ingrowing nail: Secondary | ICD-10-CM

## 2018-09-01 MED ORDER — NEOMYCIN-POLYMYXIN-HC 3.5-10000-1 OT SOLN: OTIC | 1 refills | Status: DC

## 2018-09-01 NOTE — Patient Instructions (Signed)

## 2018-09-07 NOTE — Progress Notes (Signed)
Subjective:   Patient ID: Michelle Perry, female   DOB: 52 y.o.   MRN: [id number]   HPI Patient presents with painful ingrown toenail deformity right hallux medial border that is increasingly hard for her to cut herself and she is tried to trim it she is tried soaking it without relief of symptoms   ROS      Objective:  Physical Exam  Neurovascular status intact with incurvated medial border right hallux that is painful when palpated making shoe gear difficult and patient has tried wider shoes with no indications of drainage or infective process     Assessment:  Chronic ingrown toenail deformity with pain right hallux medial border     Plan:  H&P condition reviewed and recommended removal of the border.  Explained procedure and risk and today I went ahead and I explained the procedure and patient signed a consent form.  Patient had the hallux infiltrated 60 mg like Marcaine mixture of medial border excised with sterile instrumentation matrix exposed and phenol 3 applications 30 seconds followed by alcohol lavage and sterile dressing was applied.  Gave instructions to soak and to leave dressing on 24 hours unless were to start to throb and then take it off earlier and gave instructions for Corticosporin otic solution which was prescribed along with reappoint 2 weeks and encouraged to call with any questions concerns signed visit

## 2018-09-08 DIAGNOSIS — M1712 Unilateral primary osteoarthritis, left knee: Secondary | ICD-10-CM | POA: Diagnosis not present

## 2018-09-15 DIAGNOSIS — M1712 Unilateral primary osteoarthritis, left knee: Secondary | ICD-10-CM | POA: Diagnosis not present

## 2018-09-26 DIAGNOSIS — Z1211 Encounter for screening for malignant neoplasm of colon: Secondary | ICD-10-CM | POA: Diagnosis not present

## 2018-10-15 DIAGNOSIS — D225 Melanocytic nevi of trunk: Secondary | ICD-10-CM | POA: Diagnosis not present

## 2018-10-15 DIAGNOSIS — L821 Other seborrheic keratosis: Secondary | ICD-10-CM | POA: Diagnosis not present

## 2018-10-15 DIAGNOSIS — L814 Other melanin hyperpigmentation: Secondary | ICD-10-CM | POA: Diagnosis not present

## 2018-10-15 DIAGNOSIS — Z411 Encounter for cosmetic surgery: Secondary | ICD-10-CM | POA: Diagnosis not present

## 2018-11-03 DIAGNOSIS — E782 Mixed hyperlipidemia: Secondary | ICD-10-CM | POA: Diagnosis not present

## 2018-11-03 DIAGNOSIS — E119 Type 2 diabetes mellitus without complications: Secondary | ICD-10-CM | POA: Diagnosis not present

## 2018-11-03 DIAGNOSIS — I10 Essential (primary) hypertension: Secondary | ICD-10-CM | POA: Diagnosis not present

## 2018-11-03 DIAGNOSIS — Z1231 Encounter for screening mammogram for malignant neoplasm of breast: Secondary | ICD-10-CM | POA: Diagnosis not present

## 2018-11-25 DIAGNOSIS — Z1231 Encounter for screening mammogram for malignant neoplasm of breast: Secondary | ICD-10-CM | POA: Diagnosis not present

## 2018-12-16 DIAGNOSIS — M25552 Pain in left hip: Secondary | ICD-10-CM | POA: Diagnosis not present

## 2018-12-16 DIAGNOSIS — M545 Low back pain: Secondary | ICD-10-CM | POA: Diagnosis not present

## 2018-12-16 DIAGNOSIS — M25551 Pain in right hip: Secondary | ICD-10-CM | POA: Diagnosis not present

## 2019-01-12 DIAGNOSIS — M4056 Lordosis, unspecified, lumbar region: Secondary | ICD-10-CM | POA: Diagnosis not present

## 2019-01-12 DIAGNOSIS — M545 Low back pain: Secondary | ICD-10-CM | POA: Diagnosis not present

## 2019-01-12 DIAGNOSIS — M2560 Stiffness of unspecified joint, not elsewhere classified: Secondary | ICD-10-CM | POA: Diagnosis not present

## 2019-01-13 DIAGNOSIS — M545 Low back pain: Secondary | ICD-10-CM | POA: Diagnosis not present

## 2019-01-14 DIAGNOSIS — M545 Low back pain: Secondary | ICD-10-CM | POA: Diagnosis not present

## 2019-01-14 DIAGNOSIS — M2560 Stiffness of unspecified joint, not elsewhere classified: Secondary | ICD-10-CM | POA: Diagnosis not present

## 2019-01-14 DIAGNOSIS — M4056 Lordosis, unspecified, lumbar region: Secondary | ICD-10-CM | POA: Diagnosis not present

## 2019-01-19 DIAGNOSIS — M545 Low back pain: Secondary | ICD-10-CM | POA: Diagnosis not present

## 2019-01-19 DIAGNOSIS — M4056 Lordosis, unspecified, lumbar region: Secondary | ICD-10-CM | POA: Diagnosis not present

## 2019-01-19 DIAGNOSIS — M2560 Stiffness of unspecified joint, not elsewhere classified: Secondary | ICD-10-CM | POA: Diagnosis not present

## 2019-01-21 DIAGNOSIS — M4056 Lordosis, unspecified, lumbar region: Secondary | ICD-10-CM | POA: Diagnosis not present

## 2019-01-21 DIAGNOSIS — M2560 Stiffness of unspecified joint, not elsewhere classified: Secondary | ICD-10-CM | POA: Diagnosis not present

## 2019-01-21 DIAGNOSIS — M545 Low back pain: Secondary | ICD-10-CM | POA: Diagnosis not present

## 2019-01-26 DIAGNOSIS — M2560 Stiffness of unspecified joint, not elsewhere classified: Secondary | ICD-10-CM | POA: Diagnosis not present

## 2019-01-26 DIAGNOSIS — M545 Low back pain: Secondary | ICD-10-CM | POA: Diagnosis not present

## 2019-01-26 DIAGNOSIS — M4056 Lordosis, unspecified, lumbar region: Secondary | ICD-10-CM | POA: Diagnosis not present

## 2019-01-28 DIAGNOSIS — M4056 Lordosis, unspecified, lumbar region: Secondary | ICD-10-CM | POA: Diagnosis not present

## 2019-01-28 DIAGNOSIS — M545 Low back pain: Secondary | ICD-10-CM | POA: Diagnosis not present

## 2019-01-28 DIAGNOSIS — M2560 Stiffness of unspecified joint, not elsewhere classified: Secondary | ICD-10-CM | POA: Diagnosis not present

## 2019-02-02 DIAGNOSIS — M2560 Stiffness of unspecified joint, not elsewhere classified: Secondary | ICD-10-CM | POA: Diagnosis not present

## 2019-02-02 DIAGNOSIS — M545 Low back pain: Secondary | ICD-10-CM | POA: Diagnosis not present

## 2019-02-02 DIAGNOSIS — M4056 Lordosis, unspecified, lumbar region: Secondary | ICD-10-CM | POA: Diagnosis not present

## 2019-02-04 DIAGNOSIS — M545 Low back pain: Secondary | ICD-10-CM | POA: Diagnosis not present

## 2019-02-04 DIAGNOSIS — M4056 Lordosis, unspecified, lumbar region: Secondary | ICD-10-CM | POA: Diagnosis not present

## 2019-02-04 DIAGNOSIS — M2560 Stiffness of unspecified joint, not elsewhere classified: Secondary | ICD-10-CM | POA: Diagnosis not present

## 2019-02-09 DIAGNOSIS — M2560 Stiffness of unspecified joint, not elsewhere classified: Secondary | ICD-10-CM | POA: Diagnosis not present

## 2019-02-09 DIAGNOSIS — M545 Low back pain: Secondary | ICD-10-CM | POA: Diagnosis not present

## 2019-02-09 DIAGNOSIS — M4056 Lordosis, unspecified, lumbar region: Secondary | ICD-10-CM | POA: Diagnosis not present

## 2019-02-11 DIAGNOSIS — M545 Low back pain: Secondary | ICD-10-CM | POA: Diagnosis not present

## 2019-02-11 DIAGNOSIS — M4056 Lordosis, unspecified, lumbar region: Secondary | ICD-10-CM | POA: Diagnosis not present

## 2019-02-11 DIAGNOSIS — M2560 Stiffness of unspecified joint, not elsewhere classified: Secondary | ICD-10-CM | POA: Diagnosis not present

## 2019-02-16 DIAGNOSIS — M545 Low back pain: Secondary | ICD-10-CM | POA: Diagnosis not present

## 2019-02-16 DIAGNOSIS — M4056 Lordosis, unspecified, lumbar region: Secondary | ICD-10-CM | POA: Diagnosis not present

## 2019-02-16 DIAGNOSIS — M2560 Stiffness of unspecified joint, not elsewhere classified: Secondary | ICD-10-CM | POA: Diagnosis not present

## 2019-02-17 DIAGNOSIS — M545 Low back pain: Secondary | ICD-10-CM | POA: Diagnosis not present

## 2019-02-17 DIAGNOSIS — M4056 Lordosis, unspecified, lumbar region: Secondary | ICD-10-CM | POA: Diagnosis not present

## 2019-02-18 DIAGNOSIS — M545 Low back pain: Secondary | ICD-10-CM | POA: Diagnosis not present

## 2019-02-18 DIAGNOSIS — M4056 Lordosis, unspecified, lumbar region: Secondary | ICD-10-CM | POA: Diagnosis not present

## 2019-02-18 DIAGNOSIS — M2560 Stiffness of unspecified joint, not elsewhere classified: Secondary | ICD-10-CM | POA: Diagnosis not present

## 2019-02-25 DIAGNOSIS — M2560 Stiffness of unspecified joint, not elsewhere classified: Secondary | ICD-10-CM | POA: Diagnosis not present

## 2019-02-25 DIAGNOSIS — M4056 Lordosis, unspecified, lumbar region: Secondary | ICD-10-CM | POA: Diagnosis not present

## 2019-02-25 DIAGNOSIS — M545 Low back pain: Secondary | ICD-10-CM | POA: Diagnosis not present

## 2019-02-27 DIAGNOSIS — M545 Low back pain: Secondary | ICD-10-CM | POA: Diagnosis not present

## 2019-02-27 DIAGNOSIS — M4056 Lordosis, unspecified, lumbar region: Secondary | ICD-10-CM | POA: Diagnosis not present

## 2019-02-27 DIAGNOSIS — M2560 Stiffness of unspecified joint, not elsewhere classified: Secondary | ICD-10-CM | POA: Diagnosis not present

## 2019-03-02 DIAGNOSIS — M2560 Stiffness of unspecified joint, not elsewhere classified: Secondary | ICD-10-CM | POA: Diagnosis not present

## 2019-03-02 DIAGNOSIS — M545 Low back pain: Secondary | ICD-10-CM | POA: Diagnosis not present

## 2019-03-02 DIAGNOSIS — M4056 Lordosis, unspecified, lumbar region: Secondary | ICD-10-CM | POA: Diagnosis not present

## 2019-03-04 DIAGNOSIS — M545 Low back pain: Secondary | ICD-10-CM | POA: Diagnosis not present

## 2019-03-04 DIAGNOSIS — M4056 Lordosis, unspecified, lumbar region: Secondary | ICD-10-CM | POA: Diagnosis not present

## 2019-03-04 DIAGNOSIS — M2560 Stiffness of unspecified joint, not elsewhere classified: Secondary | ICD-10-CM | POA: Diagnosis not present

## 2019-03-09 DIAGNOSIS — M4056 Lordosis, unspecified, lumbar region: Secondary | ICD-10-CM | POA: Diagnosis not present

## 2019-03-09 DIAGNOSIS — M2560 Stiffness of unspecified joint, not elsewhere classified: Secondary | ICD-10-CM | POA: Diagnosis not present

## 2019-03-09 DIAGNOSIS — M545 Low back pain: Secondary | ICD-10-CM | POA: Diagnosis not present

## 2019-03-11 DIAGNOSIS — M2560 Stiffness of unspecified joint, not elsewhere classified: Secondary | ICD-10-CM | POA: Diagnosis not present

## 2019-03-11 DIAGNOSIS — M545 Low back pain: Secondary | ICD-10-CM | POA: Diagnosis not present

## 2019-03-11 DIAGNOSIS — M4056 Lordosis, unspecified, lumbar region: Secondary | ICD-10-CM | POA: Diagnosis not present

## 2019-03-18 DIAGNOSIS — M4056 Lordosis, unspecified, lumbar region: Secondary | ICD-10-CM | POA: Diagnosis not present

## 2019-03-18 DIAGNOSIS — M2560 Stiffness of unspecified joint, not elsewhere classified: Secondary | ICD-10-CM | POA: Diagnosis not present

## 2019-03-18 DIAGNOSIS — M545 Low back pain: Secondary | ICD-10-CM | POA: Diagnosis not present

## 2019-03-20 DIAGNOSIS — M4056 Lordosis, unspecified, lumbar region: Secondary | ICD-10-CM | POA: Diagnosis not present

## 2019-03-20 DIAGNOSIS — M545 Low back pain: Secondary | ICD-10-CM | POA: Diagnosis not present

## 2019-03-20 DIAGNOSIS — M2560 Stiffness of unspecified joint, not elsewhere classified: Secondary | ICD-10-CM | POA: Diagnosis not present

## 2019-04-08 DIAGNOSIS — M47816 Spondylosis without myelopathy or radiculopathy, lumbar region: Secondary | ICD-10-CM | POA: Diagnosis not present

## 2019-04-08 DIAGNOSIS — M545 Low back pain: Secondary | ICD-10-CM | POA: Diagnosis not present

## 2019-05-04 DIAGNOSIS — K219 Gastro-esophageal reflux disease without esophagitis: Secondary | ICD-10-CM | POA: Diagnosis not present

## 2019-05-04 DIAGNOSIS — Z23 Encounter for immunization: Secondary | ICD-10-CM | POA: Diagnosis not present

## 2019-05-04 DIAGNOSIS — E782 Mixed hyperlipidemia: Secondary | ICD-10-CM | POA: Diagnosis not present

## 2019-05-04 DIAGNOSIS — I10 Essential (primary) hypertension: Secondary | ICD-10-CM | POA: Diagnosis not present

## 2019-05-04 DIAGNOSIS — E119 Type 2 diabetes mellitus without complications: Secondary | ICD-10-CM | POA: Diagnosis not present

## 2019-07-07 DIAGNOSIS — H5213 Myopia, bilateral: Secondary | ICD-10-CM | POA: Diagnosis not present

## 2019-07-07 DIAGNOSIS — E119 Type 2 diabetes mellitus without complications: Secondary | ICD-10-CM | POA: Diagnosis not present

## 2019-07-14 DIAGNOSIS — M1712 Unilateral primary osteoarthritis, left knee: Secondary | ICD-10-CM | POA: Diagnosis not present

## 2019-07-21 DIAGNOSIS — M1712 Unilateral primary osteoarthritis, left knee: Secondary | ICD-10-CM | POA: Diagnosis not present

## 2019-07-28 DIAGNOSIS — M1712 Unilateral primary osteoarthritis, left knee: Secondary | ICD-10-CM | POA: Diagnosis not present

## 2019-09-09 DIAGNOSIS — M47816 Spondylosis without myelopathy or radiculopathy, lumbar region: Secondary | ICD-10-CM | POA: Diagnosis not present

## 2019-09-14 ENCOUNTER — Other Ambulatory Visit: Payer: Self-pay

## 2019-09-14 ENCOUNTER — Encounter: Payer: Self-pay | Admitting: Podiatry

## 2019-09-14 ENCOUNTER — Ambulatory Visit (INDEPENDENT_AMBULATORY_CARE_PROVIDER_SITE_OTHER): Payer: BC Managed Care – PPO | Admitting: Podiatry

## 2019-09-14 DIAGNOSIS — L600 Ingrowing nail: Secondary | ICD-10-CM | POA: Diagnosis not present

## 2019-09-14 MED ORDER — NEOMYCIN-POLYMYXIN-HC 3.5-10000-1 OT SOLN: OTIC | 0 refills | Status: DC

## 2019-09-14 NOTE — Patient Instructions (Signed)

## 2019-09-16 NOTE — Progress Notes (Signed)
Subjective:   Patient ID: Michelle Perry, female   DOB: 53 y.o.   MRN: [id number]   HPI Patient presents stating she developed an ingrown toenail of the right big toe that is been sore and making it hard for her to wear shoe gear comfortably   ROS      Objective:  Physical Exam  Neurovascular status intact with patient found to have irritation of the right hallux medial border that has grown into the skin and creating pain with no redness or drainage noted      Assessment:  Ingrown toenail deformity right hallux medial border     Plan:  H&P condition reviewed and recommended correction of the nail border.  Explained procedure risk and patient signed consent form and today I infiltrated the right hallux 60 mg Xylocaine Marcaine mixture and I did go ahead and I then removed the medial border exposed matrix applied phenol 3 applications 30 seconds followed by alcohol lavage and sterile dressing.  Gave instructions on soaks and reappoint for [place] to recheck again and I advised this patient on leaving dressing on 24 hours but taken off earlier if needed and wrote for drops to use postoperatively

## 2019-10-05 ENCOUNTER — Other Ambulatory Visit: Payer: Self-pay | Admitting: Podiatry

## 2019-10-06 NOTE — Telephone Encounter (Signed)
Called pt regarding medication refill request for neomycin cream; pt had ingrown removed 09/02/2019 and says still have bad drainage, redness and tenderness, feels toe have become infected again; advised pt to make appt to see Dr before refill is given.

## 2019-10-07 NOTE — Telephone Encounter (Signed)
Thank you :)

## 2019-10-12 ENCOUNTER — Encounter: Payer: Self-pay | Admitting: Podiatry

## 2019-10-12 ENCOUNTER — Other Ambulatory Visit: Payer: Self-pay

## 2019-10-12 ENCOUNTER — Ambulatory Visit: Payer: BC Managed Care – PPO | Admitting: Podiatry

## 2019-10-12 DIAGNOSIS — L03031 Cellulitis of right toe: Secondary | ICD-10-CM

## 2019-10-12 MED ORDER — DOXYCYCLINE HYCLATE 100 MG PO TABS: 100.0000 mg | ORAL_TABLET | Freq: Two times a day (BID) | ORAL | 0 refills | Status: DC

## 2019-10-12 NOTE — Progress Notes (Signed)
Subjective:   Patient ID: Michelle Perry, female   DOB: 54 y.o.   MRN: [id number]   HPI Patient presents stating I am concerned about redness around my nail site that was corrected a month ago.  States it is been slightly draining and its been sore and I have still been using the drops and soak   ROS      Objective:  Physical Exam  Neurovascular status intact which appears to be well corrected nail site right hallux medial border with proximal keratotic lesion and tissue formation with slight redness but no active drainage noted     Assessment:  Possibility for low-grade paronychia infection right hallux medial side     Plan:  Advised her on soaks and trying to roughen the area up to reduce any scar tissue formation and placed on doxycycline for 7 days.  I discussed possibly opening the area up if symptoms persist or any changes were to occur and she is strongly encouraged to call with any changes which may occur but I am hoping with antibiotics it will heal uneventfully

## 2019-10-22 DIAGNOSIS — M9903 Segmental and somatic dysfunction of lumbar region: Secondary | ICD-10-CM | POA: Diagnosis not present

## 2019-10-22 DIAGNOSIS — M545 Low back pain: Secondary | ICD-10-CM | POA: Diagnosis not present

## 2019-10-22 DIAGNOSIS — Q7649 Other congenital malformations of spine, not associated with scoliosis: Secondary | ICD-10-CM | POA: Diagnosis not present

## 2019-10-22 DIAGNOSIS — M9905 Segmental and somatic dysfunction of pelvic region: Secondary | ICD-10-CM | POA: Diagnosis not present

## 2019-10-27 DIAGNOSIS — L814 Other melanin hyperpigmentation: Secondary | ICD-10-CM | POA: Diagnosis not present

## 2019-10-27 DIAGNOSIS — D225 Melanocytic nevi of trunk: Secondary | ICD-10-CM | POA: Diagnosis not present

## 2019-10-27 DIAGNOSIS — Z23 Encounter for immunization: Secondary | ICD-10-CM | POA: Diagnosis not present

## 2019-10-27 DIAGNOSIS — L821 Other seborrheic keratosis: Secondary | ICD-10-CM | POA: Diagnosis not present

## 2019-10-28 DIAGNOSIS — Q7649 Other congenital malformations of spine, not associated with scoliosis: Secondary | ICD-10-CM | POA: Diagnosis not present

## 2019-10-28 DIAGNOSIS — M9905 Segmental and somatic dysfunction of pelvic region: Secondary | ICD-10-CM | POA: Diagnosis not present

## 2019-10-28 DIAGNOSIS — M545 Low back pain: Secondary | ICD-10-CM | POA: Diagnosis not present

## 2019-10-28 DIAGNOSIS — M9903 Segmental and somatic dysfunction of lumbar region: Secondary | ICD-10-CM | POA: Diagnosis not present

## 2019-11-02 DIAGNOSIS — M9905 Segmental and somatic dysfunction of pelvic region: Secondary | ICD-10-CM | POA: Diagnosis not present

## 2019-11-02 DIAGNOSIS — Q7649 Other congenital malformations of spine, not associated with scoliosis: Secondary | ICD-10-CM | POA: Diagnosis not present

## 2019-11-02 DIAGNOSIS — Z1331 Encounter for screening for depression: Secondary | ICD-10-CM | POA: Diagnosis not present

## 2019-11-02 DIAGNOSIS — M545 Low back pain: Secondary | ICD-10-CM | POA: Diagnosis not present

## 2019-11-02 DIAGNOSIS — I10 Essential (primary) hypertension: Secondary | ICD-10-CM | POA: Diagnosis not present

## 2019-11-02 DIAGNOSIS — E119 Type 2 diabetes mellitus without complications: Secondary | ICD-10-CM | POA: Diagnosis not present

## 2019-11-02 DIAGNOSIS — M9903 Segmental and somatic dysfunction of lumbar region: Secondary | ICD-10-CM | POA: Diagnosis not present

## 2019-11-02 DIAGNOSIS — E782 Mixed hyperlipidemia: Secondary | ICD-10-CM | POA: Diagnosis not present

## 2019-11-05 DIAGNOSIS — M545 Low back pain: Secondary | ICD-10-CM | POA: Diagnosis not present

## 2019-11-05 DIAGNOSIS — Q7649 Other congenital malformations of spine, not associated with scoliosis: Secondary | ICD-10-CM | POA: Diagnosis not present

## 2019-11-05 DIAGNOSIS — M9903 Segmental and somatic dysfunction of lumbar region: Secondary | ICD-10-CM | POA: Diagnosis not present

## 2019-11-05 DIAGNOSIS — M9905 Segmental and somatic dysfunction of pelvic region: Secondary | ICD-10-CM | POA: Diagnosis not present

## 2019-11-09 DIAGNOSIS — M9905 Segmental and somatic dysfunction of pelvic region: Secondary | ICD-10-CM | POA: Diagnosis not present

## 2019-11-09 DIAGNOSIS — M9903 Segmental and somatic dysfunction of lumbar region: Secondary | ICD-10-CM | POA: Diagnosis not present

## 2019-11-09 DIAGNOSIS — Q7649 Other congenital malformations of spine, not associated with scoliosis: Secondary | ICD-10-CM | POA: Diagnosis not present

## 2019-11-09 DIAGNOSIS — M545 Low back pain: Secondary | ICD-10-CM | POA: Diagnosis not present

## 2019-11-10 DIAGNOSIS — Z20822 Contact with and (suspected) exposure to covid-19: Secondary | ICD-10-CM | POA: Diagnosis not present

## 2019-11-11 DIAGNOSIS — Q7649 Other congenital malformations of spine, not associated with scoliosis: Secondary | ICD-10-CM | POA: Diagnosis not present

## 2019-11-11 DIAGNOSIS — M545 Low back pain: Secondary | ICD-10-CM | POA: Diagnosis not present

## 2019-11-11 DIAGNOSIS — M9903 Segmental and somatic dysfunction of lumbar region: Secondary | ICD-10-CM | POA: Diagnosis not present

## 2019-11-11 DIAGNOSIS — M9905 Segmental and somatic dysfunction of pelvic region: Secondary | ICD-10-CM | POA: Diagnosis not present

## 2019-11-12 DIAGNOSIS — Q7649 Other congenital malformations of spine, not associated with scoliosis: Secondary | ICD-10-CM | POA: Diagnosis not present

## 2019-11-12 DIAGNOSIS — M9903 Segmental and somatic dysfunction of lumbar region: Secondary | ICD-10-CM | POA: Diagnosis not present

## 2019-11-12 DIAGNOSIS — M9905 Segmental and somatic dysfunction of pelvic region: Secondary | ICD-10-CM | POA: Diagnosis not present

## 2019-11-12 DIAGNOSIS — M545 Low back pain: Secondary | ICD-10-CM | POA: Diagnosis not present

## 2019-11-16 DIAGNOSIS — M545 Low back pain: Secondary | ICD-10-CM | POA: Diagnosis not present

## 2019-11-16 DIAGNOSIS — M9905 Segmental and somatic dysfunction of pelvic region: Secondary | ICD-10-CM | POA: Diagnosis not present

## 2019-11-16 DIAGNOSIS — M9903 Segmental and somatic dysfunction of lumbar region: Secondary | ICD-10-CM | POA: Diagnosis not present

## 2019-11-16 DIAGNOSIS — Q7649 Other congenital malformations of spine, not associated with scoliosis: Secondary | ICD-10-CM | POA: Diagnosis not present

## 2019-11-18 DIAGNOSIS — Q7649 Other congenital malformations of spine, not associated with scoliosis: Secondary | ICD-10-CM | POA: Diagnosis not present

## 2019-11-18 DIAGNOSIS — M9903 Segmental and somatic dysfunction of lumbar region: Secondary | ICD-10-CM | POA: Diagnosis not present

## 2019-11-18 DIAGNOSIS — M545 Low back pain: Secondary | ICD-10-CM | POA: Diagnosis not present

## 2019-11-18 DIAGNOSIS — M9905 Segmental and somatic dysfunction of pelvic region: Secondary | ICD-10-CM | POA: Diagnosis not present

## 2019-11-19 DIAGNOSIS — M9905 Segmental and somatic dysfunction of pelvic region: Secondary | ICD-10-CM | POA: Diagnosis not present

## 2019-11-19 DIAGNOSIS — M545 Low back pain: Secondary | ICD-10-CM | POA: Diagnosis not present

## 2019-11-19 DIAGNOSIS — M9903 Segmental and somatic dysfunction of lumbar region: Secondary | ICD-10-CM | POA: Diagnosis not present

## 2019-11-19 DIAGNOSIS — Q7649 Other congenital malformations of spine, not associated with scoliosis: Secondary | ICD-10-CM | POA: Diagnosis not present

## 2019-11-23 DIAGNOSIS — Q7649 Other congenital malformations of spine, not associated with scoliosis: Secondary | ICD-10-CM | POA: Diagnosis not present

## 2019-11-23 DIAGNOSIS — M9905 Segmental and somatic dysfunction of pelvic region: Secondary | ICD-10-CM | POA: Diagnosis not present

## 2019-11-23 DIAGNOSIS — M9903 Segmental and somatic dysfunction of lumbar region: Secondary | ICD-10-CM | POA: Diagnosis not present

## 2019-11-23 DIAGNOSIS — M545 Low back pain: Secondary | ICD-10-CM | POA: Diagnosis not present

## 2019-11-25 DIAGNOSIS — M9905 Segmental and somatic dysfunction of pelvic region: Secondary | ICD-10-CM | POA: Diagnosis not present

## 2019-11-25 DIAGNOSIS — M545 Low back pain: Secondary | ICD-10-CM | POA: Diagnosis not present

## 2019-11-25 DIAGNOSIS — M9903 Segmental and somatic dysfunction of lumbar region: Secondary | ICD-10-CM | POA: Diagnosis not present

## 2019-11-25 DIAGNOSIS — Q7649 Other congenital malformations of spine, not associated with scoliosis: Secondary | ICD-10-CM | POA: Diagnosis not present

## 2019-11-27 ENCOUNTER — Encounter: Payer: Self-pay | Admitting: Podiatry

## 2019-11-27 ENCOUNTER — Ambulatory Visit (INDEPENDENT_AMBULATORY_CARE_PROVIDER_SITE_OTHER): Payer: BC Managed Care – PPO | Admitting: Podiatry

## 2019-11-27 ENCOUNTER — Other Ambulatory Visit: Payer: Self-pay

## 2019-11-27 VITALS — Temp 96.9°F

## 2019-11-27 DIAGNOSIS — L600 Ingrowing nail: Secondary | ICD-10-CM

## 2019-11-27 NOTE — Progress Notes (Signed)
Subjective:   Patient ID: Michelle Perry, female   DOB: 54 y.o.   MRN: [id number]   HPI Patient presents concerned about a irritated hallux nail right that had previous procedure done and states it does not hurt but it is thick   ROS      Objective:  Physical Exam  Neurovascular status intact with some change of nailbed tissue on the medial side of the right hallux localized in nature     Assessment:  Mild ingrown toenail deformity right hallux that appears to be more scar tissue than actual nail growth     Plan:  H&P reviewed condition and I debrided the tissue to take pressure off of it and advised on doing this as needed but do not recommend any permanent procedure unless it were to become more of a problem

## 2019-11-30 DIAGNOSIS — M9903 Segmental and somatic dysfunction of lumbar region: Secondary | ICD-10-CM | POA: Diagnosis not present

## 2019-11-30 DIAGNOSIS — M9905 Segmental and somatic dysfunction of pelvic region: Secondary | ICD-10-CM | POA: Diagnosis not present

## 2019-11-30 DIAGNOSIS — M545 Low back pain: Secondary | ICD-10-CM | POA: Diagnosis not present

## 2019-11-30 DIAGNOSIS — Q7649 Other congenital malformations of spine, not associated with scoliosis: Secondary | ICD-10-CM | POA: Diagnosis not present

## 2019-12-02 DIAGNOSIS — Q7649 Other congenital malformations of spine, not associated with scoliosis: Secondary | ICD-10-CM | POA: Diagnosis not present

## 2019-12-02 DIAGNOSIS — M9903 Segmental and somatic dysfunction of lumbar region: Secondary | ICD-10-CM | POA: Diagnosis not present

## 2019-12-02 DIAGNOSIS — M545 Low back pain: Secondary | ICD-10-CM | POA: Diagnosis not present

## 2019-12-02 DIAGNOSIS — M9905 Segmental and somatic dysfunction of pelvic region: Secondary | ICD-10-CM | POA: Diagnosis not present

## 2019-12-07 DIAGNOSIS — M9905 Segmental and somatic dysfunction of pelvic region: Secondary | ICD-10-CM | POA: Diagnosis not present

## 2019-12-07 DIAGNOSIS — M545 Low back pain: Secondary | ICD-10-CM | POA: Diagnosis not present

## 2019-12-07 DIAGNOSIS — Q7649 Other congenital malformations of spine, not associated with scoliosis: Secondary | ICD-10-CM | POA: Diagnosis not present

## 2019-12-07 DIAGNOSIS — M9903 Segmental and somatic dysfunction of lumbar region: Secondary | ICD-10-CM | POA: Diagnosis not present

## 2019-12-09 DIAGNOSIS — M545 Low back pain: Secondary | ICD-10-CM | POA: Diagnosis not present

## 2019-12-09 DIAGNOSIS — Q7649 Other congenital malformations of spine, not associated with scoliosis: Secondary | ICD-10-CM | POA: Diagnosis not present

## 2019-12-09 DIAGNOSIS — M9903 Segmental and somatic dysfunction of lumbar region: Secondary | ICD-10-CM | POA: Diagnosis not present

## 2019-12-09 DIAGNOSIS — M9905 Segmental and somatic dysfunction of pelvic region: Secondary | ICD-10-CM | POA: Diagnosis not present

## 2019-12-16 DIAGNOSIS — M9903 Segmental and somatic dysfunction of lumbar region: Secondary | ICD-10-CM | POA: Diagnosis not present

## 2019-12-16 DIAGNOSIS — M545 Low back pain: Secondary | ICD-10-CM | POA: Diagnosis not present

## 2019-12-16 DIAGNOSIS — M9905 Segmental and somatic dysfunction of pelvic region: Secondary | ICD-10-CM | POA: Diagnosis not present

## 2019-12-16 DIAGNOSIS — Q7649 Other congenital malformations of spine, not associated with scoliosis: Secondary | ICD-10-CM | POA: Diagnosis not present

## 2019-12-16 NOTE — Telephone Encounter (Signed)
Pt needs appointmnet

## 2019-12-18 DIAGNOSIS — Z1231 Encounter for screening mammogram for malignant neoplasm of breast: Secondary | ICD-10-CM | POA: Diagnosis not present

## 2019-12-23 DIAGNOSIS — M9905 Segmental and somatic dysfunction of pelvic region: Secondary | ICD-10-CM | POA: Diagnosis not present

## 2019-12-23 DIAGNOSIS — M545 Low back pain: Secondary | ICD-10-CM | POA: Diagnosis not present

## 2019-12-23 DIAGNOSIS — M9903 Segmental and somatic dysfunction of lumbar region: Secondary | ICD-10-CM | POA: Diagnosis not present

## 2019-12-23 DIAGNOSIS — Q7649 Other congenital malformations of spine, not associated with scoliosis: Secondary | ICD-10-CM | POA: Diagnosis not present

## 2020-01-06 DIAGNOSIS — M9905 Segmental and somatic dysfunction of pelvic region: Secondary | ICD-10-CM | POA: Diagnosis not present

## 2020-01-06 DIAGNOSIS — Q7649 Other congenital malformations of spine, not associated with scoliosis: Secondary | ICD-10-CM | POA: Diagnosis not present

## 2020-01-06 DIAGNOSIS — M545 Low back pain: Secondary | ICD-10-CM | POA: Diagnosis not present

## 2020-01-06 DIAGNOSIS — M9903 Segmental and somatic dysfunction of lumbar region: Secondary | ICD-10-CM | POA: Diagnosis not present

## 2020-01-20 DIAGNOSIS — M9903 Segmental and somatic dysfunction of lumbar region: Secondary | ICD-10-CM | POA: Diagnosis not present

## 2020-01-20 DIAGNOSIS — Q7649 Other congenital malformations of spine, not associated with scoliosis: Secondary | ICD-10-CM | POA: Diagnosis not present

## 2020-01-20 DIAGNOSIS — M545 Low back pain: Secondary | ICD-10-CM | POA: Diagnosis not present

## 2020-01-20 DIAGNOSIS — M9905 Segmental and somatic dysfunction of pelvic region: Secondary | ICD-10-CM | POA: Diagnosis not present

## 2020-03-16 DIAGNOSIS — Z6841 Body Mass Index (BMI) 40.0 and over, adult: Secondary | ICD-10-CM | POA: Diagnosis not present

## 2020-03-16 DIAGNOSIS — H109 Unspecified conjunctivitis: Secondary | ICD-10-CM | POA: Diagnosis not present

## 2020-05-06 DIAGNOSIS — I10 Essential (primary) hypertension: Secondary | ICD-10-CM | POA: Diagnosis not present

## 2020-05-06 DIAGNOSIS — K219 Gastro-esophageal reflux disease without esophagitis: Secondary | ICD-10-CM | POA: Diagnosis not present

## 2020-05-06 DIAGNOSIS — E119 Type 2 diabetes mellitus without complications: Secondary | ICD-10-CM | POA: Diagnosis not present

## 2020-05-06 DIAGNOSIS — Z79899 Other long term (current) drug therapy: Secondary | ICD-10-CM | POA: Diagnosis not present

## 2020-05-06 DIAGNOSIS — E782 Mixed hyperlipidemia: Secondary | ICD-10-CM | POA: Diagnosis not present

## 2020-05-24 DIAGNOSIS — M8580 Other specified disorders of bone density and structure, unspecified site: Secondary | ICD-10-CM | POA: Diagnosis not present

## 2020-05-24 DIAGNOSIS — M47816 Spondylosis without myelopathy or radiculopathy, lumbar region: Secondary | ICD-10-CM | POA: Diagnosis not present

## 2020-05-24 DIAGNOSIS — M545 Low back pain: Secondary | ICD-10-CM | POA: Diagnosis not present

## 2020-05-24 DIAGNOSIS — E559 Vitamin D deficiency, unspecified: Secondary | ICD-10-CM | POA: Diagnosis not present

## 2020-06-01 DIAGNOSIS — Z780 Asymptomatic menopausal state: Secondary | ICD-10-CM | POA: Diagnosis not present

## 2020-06-01 DIAGNOSIS — Z13820 Encounter for screening for osteoporosis: Secondary | ICD-10-CM | POA: Diagnosis not present

## 2020-06-08 DIAGNOSIS — M47816 Spondylosis without myelopathy or radiculopathy, lumbar region: Secondary | ICD-10-CM | POA: Diagnosis not present

## 2020-06-20 DIAGNOSIS — H9192 Unspecified hearing loss, left ear: Secondary | ICD-10-CM | POA: Diagnosis not present

## 2020-06-20 DIAGNOSIS — H6982 Other specified disorders of Eustachian tube, left ear: Secondary | ICD-10-CM | POA: Diagnosis not present

## 2020-07-08 DIAGNOSIS — Z20822 Contact with and (suspected) exposure to covid-19: Secondary | ICD-10-CM | POA: Diagnosis not present

## 2020-07-08 DIAGNOSIS — J029 Acute pharyngitis, unspecified: Secondary | ICD-10-CM | POA: Diagnosis not present

## 2020-08-01 DIAGNOSIS — M26622 Arthralgia of left temporomandibular joint: Secondary | ICD-10-CM | POA: Diagnosis not present

## 2020-08-01 DIAGNOSIS — H903 Sensorineural hearing loss, bilateral: Secondary | ICD-10-CM | POA: Diagnosis not present

## 2020-08-01 DIAGNOSIS — H6983 Other specified disorders of Eustachian tube, bilateral: Secondary | ICD-10-CM | POA: Diagnosis not present

## 2020-08-01 DIAGNOSIS — H9192 Unspecified hearing loss, left ear: Secondary | ICD-10-CM | POA: Diagnosis not present

## 2020-08-03 DIAGNOSIS — Z6841 Body Mass Index (BMI) 40.0 and over, adult: Secondary | ICD-10-CM | POA: Diagnosis not present

## 2020-08-03 DIAGNOSIS — E119 Type 2 diabetes mellitus without complications: Secondary | ICD-10-CM | POA: Diagnosis not present

## 2020-08-03 DIAGNOSIS — Z0000 Encounter for general adult medical examination without abnormal findings: Secondary | ICD-10-CM | POA: Diagnosis not present

## 2020-08-03 DIAGNOSIS — Z23 Encounter for immunization: Secondary | ICD-10-CM | POA: Diagnosis not present

## 2020-08-14 DIAGNOSIS — Z20822 Contact with and (suspected) exposure to covid-19: Secondary | ICD-10-CM | POA: Diagnosis not present

## 2020-08-31 DIAGNOSIS — M1712 Unilateral primary osteoarthritis, left knee: Secondary | ICD-10-CM | POA: Diagnosis not present

## 2020-09-14 DIAGNOSIS — M1712 Unilateral primary osteoarthritis, left knee: Secondary | ICD-10-CM | POA: Diagnosis not present

## 2020-09-21 DIAGNOSIS — M1712 Unilateral primary osteoarthritis, left knee: Secondary | ICD-10-CM | POA: Diagnosis not present

## 2020-09-28 DIAGNOSIS — M1712 Unilateral primary osteoarthritis, left knee: Secondary | ICD-10-CM | POA: Diagnosis not present

## 2020-11-03 DIAGNOSIS — E119 Type 2 diabetes mellitus without complications: Secondary | ICD-10-CM | POA: Diagnosis not present

## 2020-11-03 DIAGNOSIS — H5213 Myopia, bilateral: Secondary | ICD-10-CM | POA: Diagnosis not present

## 2020-11-06 DIAGNOSIS — Z20822 Contact with and (suspected) exposure to covid-19: Secondary | ICD-10-CM | POA: Diagnosis not present

## 2020-11-08 DIAGNOSIS — K219 Gastro-esophageal reflux disease without esophagitis: Secondary | ICD-10-CM | POA: Diagnosis not present

## 2020-11-08 DIAGNOSIS — E119 Type 2 diabetes mellitus without complications: Secondary | ICD-10-CM | POA: Diagnosis not present

## 2020-11-08 DIAGNOSIS — I10 Essential (primary) hypertension: Secondary | ICD-10-CM | POA: Diagnosis not present

## 2020-11-08 DIAGNOSIS — Z1331 Encounter for screening for depression: Secondary | ICD-10-CM | POA: Diagnosis not present

## 2020-11-08 DIAGNOSIS — E782 Mixed hyperlipidemia: Secondary | ICD-10-CM | POA: Diagnosis not present

## 2020-11-24 DIAGNOSIS — L814 Other melanin hyperpigmentation: Secondary | ICD-10-CM | POA: Diagnosis not present

## 2020-11-24 DIAGNOSIS — D225 Melanocytic nevi of trunk: Secondary | ICD-10-CM | POA: Diagnosis not present

## 2020-11-24 DIAGNOSIS — L578 Other skin changes due to chronic exposure to nonionizing radiation: Secondary | ICD-10-CM | POA: Diagnosis not present

## 2020-11-24 DIAGNOSIS — Z411 Encounter for cosmetic surgery: Secondary | ICD-10-CM | POA: Diagnosis not present

## 2020-11-30 DIAGNOSIS — M2141 Flat foot [pes planus] (acquired), right foot: Secondary | ICD-10-CM | POA: Diagnosis not present

## 2020-11-30 DIAGNOSIS — B351 Tinea unguium: Secondary | ICD-10-CM | POA: Diagnosis not present

## 2020-11-30 DIAGNOSIS — M205X2 Other deformities of toe(s) (acquired), left foot: Secondary | ICD-10-CM | POA: Diagnosis not present

## 2020-11-30 DIAGNOSIS — M2142 Flat foot [pes planus] (acquired), left foot: Secondary | ICD-10-CM | POA: Diagnosis not present

## 2020-11-30 DIAGNOSIS — E1351 Other specified diabetes mellitus with diabetic peripheral angiopathy without gangrene: Secondary | ICD-10-CM | POA: Diagnosis not present

## 2020-12-19 DIAGNOSIS — Z1231 Encounter for screening mammogram for malignant neoplasm of breast: Secondary | ICD-10-CM | POA: Diagnosis not present

## 2020-12-21 DIAGNOSIS — M79672 Pain in left foot: Secondary | ICD-10-CM | POA: Diagnosis not present

## 2020-12-21 DIAGNOSIS — M21612 Bunion of left foot: Secondary | ICD-10-CM | POA: Diagnosis not present

## 2020-12-21 DIAGNOSIS — E119 Type 2 diabetes mellitus without complications: Secondary | ICD-10-CM | POA: Diagnosis not present

## 2020-12-21 DIAGNOSIS — M2142 Flat foot [pes planus] (acquired), left foot: Secondary | ICD-10-CM | POA: Diagnosis not present

## 2021-01-05 DIAGNOSIS — N6324 Unspecified lump in the left breast, lower inner quadrant: Secondary | ICD-10-CM | POA: Diagnosis not present

## 2021-01-05 DIAGNOSIS — R928 Other abnormal and inconclusive findings on diagnostic imaging of breast: Secondary | ICD-10-CM | POA: Diagnosis not present

## 2021-01-31 DIAGNOSIS — M1712 Unilateral primary osteoarthritis, left knee: Secondary | ICD-10-CM | POA: Diagnosis not present

## 2021-01-31 DIAGNOSIS — S83282A Other tear of lateral meniscus, current injury, left knee, initial encounter: Secondary | ICD-10-CM | POA: Diagnosis not present

## 2021-04-22 DIAGNOSIS — M25562 Pain in left knee: Secondary | ICD-10-CM | POA: Diagnosis not present

## 2021-05-09 DIAGNOSIS — M1712 Unilateral primary osteoarthritis, left knee: Secondary | ICD-10-CM | POA: Diagnosis not present

## 2021-05-09 DIAGNOSIS — S83282D Other tear of lateral meniscus, current injury, left knee, subsequent encounter: Secondary | ICD-10-CM | POA: Diagnosis not present

## 2021-05-17 DIAGNOSIS — M25562 Pain in left knee: Secondary | ICD-10-CM | POA: Diagnosis not present

## 2021-05-17 DIAGNOSIS — Z6841 Body Mass Index (BMI) 40.0 and over, adult: Secondary | ICD-10-CM | POA: Diagnosis not present

## 2021-05-17 DIAGNOSIS — M79652 Pain in left thigh: Secondary | ICD-10-CM | POA: Diagnosis not present

## 2021-05-24 DIAGNOSIS — Z79899 Other long term (current) drug therapy: Secondary | ICD-10-CM | POA: Diagnosis not present

## 2021-05-24 DIAGNOSIS — I10 Essential (primary) hypertension: Secondary | ICD-10-CM | POA: Diagnosis not present

## 2021-05-24 DIAGNOSIS — E785 Hyperlipidemia, unspecified: Secondary | ICD-10-CM | POA: Diagnosis not present

## 2021-05-24 DIAGNOSIS — E119 Type 2 diabetes mellitus without complications: Secondary | ICD-10-CM | POA: Diagnosis not present

## 2021-05-24 DIAGNOSIS — E1169 Type 2 diabetes mellitus with other specified complication: Secondary | ICD-10-CM | POA: Diagnosis not present

## 2021-05-24 DIAGNOSIS — E782 Mixed hyperlipidemia: Secondary | ICD-10-CM | POA: Diagnosis not present

## 2021-05-25 DIAGNOSIS — M1712 Unilateral primary osteoarthritis, left knee: Secondary | ICD-10-CM | POA: Diagnosis not present

## 2021-06-13 DIAGNOSIS — M1712 Unilateral primary osteoarthritis, left knee: Secondary | ICD-10-CM | POA: Diagnosis not present

## 2021-06-20 DIAGNOSIS — M1712 Unilateral primary osteoarthritis, left knee: Secondary | ICD-10-CM | POA: Diagnosis not present

## 2021-06-21 DIAGNOSIS — M1710 Unilateral primary osteoarthritis, unspecified knee: Secondary | ICD-10-CM | POA: Diagnosis not present

## 2021-06-21 DIAGNOSIS — K2100 Gastro-esophageal reflux disease with esophagitis, without bleeding: Secondary | ICD-10-CM | POA: Diagnosis not present

## 2021-06-21 DIAGNOSIS — E119 Type 2 diabetes mellitus without complications: Secondary | ICD-10-CM | POA: Diagnosis not present

## 2021-06-22 ENCOUNTER — Other Ambulatory Visit (HOSPITAL_BASED_OUTPATIENT_CLINIC_OR_DEPARTMENT_OTHER): Payer: Self-pay | Admitting: General Surgery

## 2021-06-22 ENCOUNTER — Other Ambulatory Visit (HOSPITAL_COMMUNITY): Payer: Self-pay | Admitting: General Surgery

## 2021-06-22 DIAGNOSIS — K2100 Gastro-esophageal reflux disease with esophagitis, without bleeding: Secondary | ICD-10-CM

## 2021-06-22 DIAGNOSIS — E119 Type 2 diabetes mellitus without complications: Secondary | ICD-10-CM

## 2021-06-22 DIAGNOSIS — M1710 Unilateral primary osteoarthritis, unspecified knee: Secondary | ICD-10-CM

## 2021-06-22 DIAGNOSIS — I10 Essential (primary) hypertension: Secondary | ICD-10-CM

## 2021-06-27 DIAGNOSIS — M1712 Unilateral primary osteoarthritis, left knee: Secondary | ICD-10-CM | POA: Diagnosis not present

## 2021-06-27 DIAGNOSIS — M1710 Unilateral primary osteoarthritis, unspecified knee: Secondary | ICD-10-CM | POA: Diagnosis not present

## 2021-06-27 DIAGNOSIS — E119 Type 2 diabetes mellitus without complications: Secondary | ICD-10-CM | POA: Diagnosis not present

## 2021-06-27 DIAGNOSIS — K2100 Gastro-esophageal reflux disease with esophagitis, without bleeding: Secondary | ICD-10-CM | POA: Diagnosis not present

## 2021-06-27 DIAGNOSIS — I10 Essential (primary) hypertension: Secondary | ICD-10-CM | POA: Diagnosis not present

## 2021-07-03 DIAGNOSIS — F5089 Other specified eating disorder: Secondary | ICD-10-CM | POA: Diagnosis not present

## 2021-07-06 ENCOUNTER — Other Ambulatory Visit (HOSPITAL_COMMUNITY): Payer: Self-pay | Admitting: *Deleted

## 2021-07-10 ENCOUNTER — Ambulatory Visit (HOSPITAL_COMMUNITY)
Admission: RE | Admit: 2021-07-10 | Discharge: 2021-07-10 | Disposition: A | Discharge status: Home / Self Care | Payer: BC Managed Care – PPO | Source: Ambulatory Visit | Attending: General Surgery | Admitting: General Surgery

## 2021-07-10 ENCOUNTER — Other Ambulatory Visit: Payer: Self-pay

## 2021-07-10 DIAGNOSIS — K219 Gastro-esophageal reflux disease without esophagitis: Secondary | ICD-10-CM | POA: Diagnosis not present

## 2021-07-10 DIAGNOSIS — I10 Essential (primary) hypertension: Secondary | ICD-10-CM | POA: Diagnosis not present

## 2021-07-10 DIAGNOSIS — Z8719 Personal history of other diseases of the digestive system: Secondary | ICD-10-CM | POA: Diagnosis not present

## 2021-07-10 DIAGNOSIS — E119 Type 2 diabetes mellitus without complications: Secondary | ICD-10-CM

## 2021-07-10 DIAGNOSIS — K2100 Gastro-esophageal reflux disease with esophagitis, without bleeding: Secondary | ICD-10-CM | POA: Diagnosis not present

## 2021-07-10 DIAGNOSIS — Z01818 Encounter for other preprocedural examination: Secondary | ICD-10-CM | POA: Diagnosis not present

## 2021-07-10 DIAGNOSIS — M1710 Unilateral primary osteoarthritis, unspecified knee: Secondary | ICD-10-CM | POA: Diagnosis not present

## 2021-07-11 DIAGNOSIS — F5089 Other specified eating disorder: Secondary | ICD-10-CM | POA: Diagnosis not present

## 2021-07-13 DIAGNOSIS — M25562 Pain in left knee: Secondary | ICD-10-CM | POA: Diagnosis not present

## 2021-07-13 DIAGNOSIS — R269 Unspecified abnormalities of gait and mobility: Secondary | ICD-10-CM | POA: Diagnosis not present

## 2021-07-24 DIAGNOSIS — R269 Unspecified abnormalities of gait and mobility: Secondary | ICD-10-CM | POA: Diagnosis not present

## 2021-07-24 DIAGNOSIS — M25562 Pain in left knee: Secondary | ICD-10-CM | POA: Diagnosis not present

## 2021-07-27 ENCOUNTER — Other Ambulatory Visit: Payer: Self-pay

## 2021-07-27 ENCOUNTER — Encounter: Payer: BC Managed Care – PPO | Admitting: Skilled Nursing Facility1

## 2021-07-27 ENCOUNTER — Encounter: Payer: BC Managed Care – PPO | Attending: General Surgery | Admitting: Skilled Nursing Facility1

## 2021-07-27 DIAGNOSIS — E669 Obesity, unspecified: Secondary | ICD-10-CM | POA: Diagnosis not present

## 2021-07-27 NOTE — Progress Notes (Signed)
Nutrition Assessment for Bariatric Surgery Medical Nutrition Therapy Appt Start Time: 4:15    End Time: 5:15  Patient was seen on 07/27/2021 for Pre-Operative Nutrition Assessment. Letter of approval faxed to [facility] Surgery bariatric surgery program coordinator on 07/27/2021.   Referral stated Supervised Weight Loss (SWL) visits needed: 0  Pt completed visits.   Pt has cleared nutrition requirements.   Planned surgery: sleeve gastrectomy  Pt expectation of surgery: to lose weight Pt expectation of dietitian: none identified     NUTRITION ASSESSMENT   Anthropometrics  Start weight at [facility]: 264 lbs (date: 07/27/2021)  Height: 65 in BMI: 43.93 kg/m2     Clinical  Medical hx: DM, HTN, hypercholesterolemia  Medications: see list   Labs: A1C 6.5, iron saturation 14,  Notable signs/symptoms: knee pain Any previous deficiencies? No  Micronutrient Nutrition Focused Physical Exam: Hair: No issues observed Eyes: No issues observed Mouth: No issues observed Neck: No issues observed Nails: No issues observed Skin: No issues observed  Lifestyle & Dietary Hx  Pt state she has had diabetes for about 5+ years. Pt states she does check her blood sugars daily: fasting 125-130; pt state she does check 2 hours faster she eats stating it is always under 200 but not sure specifically.  Pt is hypoglycemic aware.   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 Pt state she has worked really hard to control her portions and eat a balanced meal.   24-Hr Dietary Recall First Meal: cereal + protein powder  Snack:  Second Meal: sandwich + mayo + meat + fruit + chips Snack: yogurt + granola Third Meal: meat + rice + cabbage Snack:  Beverages: diet drinks, water   Estimated Energy Needs Calories: 1600   NUTRITION DIAGNOSIS  Overweight/obesity ([place]-3.3) related to past poor dietary habits and physical inactivity as evidenced by patient w/  planned sleeve gastrectomy surgery following dietary guidelines for continued weight loss.    NUTRITION INTERVENTION  Nutrition counseling (C-1) and education (E-2) to facilitate bariatric surgery goals.  Educated pt on micronutrient deficiencies post surgery and strategies to mitigate that risk   Pre-Op Goals Reviewed with the Patient Track food and beverage intake (pen and paper, MyFitness Pal, Baritastic app, etc.) Make healthy food choices while monitoring portion sizes Consume 3 meals per day or try to eat every 3-5 hours Avoid concentrated sugars and fried foods Keep sugar & fat in the single digits per serving on food labels Practice CHEWING your food (aim for applesauce consistency) Practice not drinking 15 minutes before, during, and 30 minutes after each meal and snack Avoid all carbonated beverages (ex: soda, sparkling beverages)  Limit caffeinated beverages (ex: coffee, tea, energy drinks) Avoid all sugar-sweetened beverages (ex: regular soda, sports drinks)  Avoid alcohol  Aim for 64-100 ounces of FLUID daily (with at least half of fluid intake being plain water)  Aim for at least 60-80 grams of PROTEIN daily Look for a liquid protein source that contains ?15 g protein and ?5 g carbohydrate (ex: shakes, drinks, shots) Make a list of non-food related activities Physical activity is an important part of a healthy lifestyle so keep it moving! The goal is to reach 150 minutes of exercise per week, including cardiovascular and weight baring activity.  *Goals that are bolded indicate the pt would like to start working towards these  Handouts Provided Include  Bariatric Surgery handouts (Nutrition Visits, Pre-Op Goals, Protein Shakes, Vitamins & Minerals)  Learning Style & Readiness for Change Teaching method utilized: Visual & Auditory  Demonstrated degree of understanding via: Teach Back  Readiness Level: action Barriers to learning/adherence to lifestyle change: none  identified    MONITORING & EVALUATION Dietary intake, weekly physical activity, body weight, and pre-op goals reached at next nutrition visit.    Next Steps  Patient is to follow up at [facility] for Pre-Op Class >2 weeks before surgery for further nutrition education.  Pt has completed visits. No further supervised visits required/recomended

## 2021-08-07 DIAGNOSIS — M205X2 Other deformities of toe(s) (acquired), left foot: Secondary | ICD-10-CM | POA: Diagnosis not present

## 2021-08-07 DIAGNOSIS — M21612 Bunion of left foot: Secondary | ICD-10-CM | POA: Diagnosis not present

## 2021-08-09 DIAGNOSIS — M25562 Pain in left knee: Secondary | ICD-10-CM | POA: Diagnosis not present

## 2021-08-09 DIAGNOSIS — R269 Unspecified abnormalities of gait and mobility: Secondary | ICD-10-CM | POA: Diagnosis not present

## 2021-08-10 DIAGNOSIS — E119 Type 2 diabetes mellitus without complications: Secondary | ICD-10-CM | POA: Diagnosis not present

## 2021-08-10 DIAGNOSIS — M545 Low back pain, unspecified: Secondary | ICD-10-CM | POA: Diagnosis not present

## 2021-08-10 DIAGNOSIS — Z01818 Encounter for other preprocedural examination: Secondary | ICD-10-CM | POA: Diagnosis not present

## 2021-08-10 DIAGNOSIS — G8929 Other chronic pain: Secondary | ICD-10-CM | POA: Diagnosis not present

## 2021-08-14 ENCOUNTER — Encounter: Payer: BC Managed Care – PPO | Attending: General Surgery | Admitting: Skilled Nursing Facility1

## 2021-08-14 ENCOUNTER — Other Ambulatory Visit: Payer: Self-pay

## 2021-08-14 DIAGNOSIS — E669 Obesity, unspecified: Secondary | ICD-10-CM | POA: Insufficient documentation

## 2021-08-14 NOTE — Progress Notes (Signed)
Pre-Operative Nutrition Class:    Patient was seen on 08/14/2021 for Pre-Operative Bariatric Surgery Education [facility]    Surgery date:  Surgery type: sleeve Start weight at [facility]: 264 Weight today: 263.6  Samples given per MNT protocol. Patient educated on appropriate usage: Ensure max exp: November 29, 2021 Ensure max lot: [id number]  Chewable bariatric advantage: advanced multi EA exp: 08/23 Chewable bariatric advantage: advanced multi EA lot: [id number]  Bariatric advantage calcium citrate exp: 02/23 Bariatric advantage calcium citrate lot: [id number]    The following the learning objectives were met by the patient during this course: Identify Pre-Op Dietary Goals and will begin 2 weeks pre-operatively Identify appropriate sources of fluids and proteins  State protein recommendations and appropriate sources pre and post-operatively Identify Post-Operative Dietary Goals and will follow for 2 weeks post-operatively Identify appropriate multivitamin and calcium sources Describe the need for physical activity post-operatively and will follow MD recommendations State when to call healthcare provider regarding medication questions or post-operative complications When having a diagnosis of diabetes understanding hypoglycemia symptoms and the inclusion of 1 complex carbohydrate per meal  Handouts given during class include: Pre-Op Bariatric Surgery Diet Handout Protein Shake Handout Post-Op Bariatric Surgery Nutrition Handout BELT Program Information Flyer Support Group Information Flyer [facility] Bariatric Supplements Price List  Follow-Up Plan: Patient will follow-up at [facility] 2 weeks post operatively for diet advancement per MD.

## 2021-08-15 DIAGNOSIS — M25552 Pain in left hip: Secondary | ICD-10-CM | POA: Diagnosis not present

## 2021-08-15 DIAGNOSIS — M1712 Unilateral primary osteoarthritis, left knee: Secondary | ICD-10-CM | POA: Diagnosis not present

## 2021-08-15 DIAGNOSIS — M25551 Pain in right hip: Secondary | ICD-10-CM | POA: Diagnosis not present

## 2021-08-17 ENCOUNTER — Ambulatory Visit: Payer: Self-pay | Admitting: General Surgery

## 2021-08-17 DIAGNOSIS — M25562 Pain in left knee: Secondary | ICD-10-CM | POA: Diagnosis not present

## 2021-08-17 DIAGNOSIS — R269 Unspecified abnormalities of gait and mobility: Secondary | ICD-10-CM | POA: Diagnosis not present

## 2021-08-18 DIAGNOSIS — M21612 Bunion of left foot: Secondary | ICD-10-CM | POA: Diagnosis not present

## 2021-08-18 DIAGNOSIS — M25572 Pain in left ankle and joints of left foot: Secondary | ICD-10-CM | POA: Diagnosis not present

## 2021-08-18 DIAGNOSIS — M2042 Other hammer toe(s) (acquired), left foot: Secondary | ICD-10-CM | POA: Diagnosis not present

## 2021-08-18 DIAGNOSIS — M21962 Unspecified acquired deformity of left lower leg: Secondary | ICD-10-CM | POA: Diagnosis not present

## 2021-08-18 DIAGNOSIS — M2012 Hallux valgus (acquired), left foot: Secondary | ICD-10-CM | POA: Diagnosis not present

## 2021-08-18 DIAGNOSIS — M205X2 Other deformities of toe(s) (acquired), left foot: Secondary | ICD-10-CM | POA: Diagnosis not present

## 2021-08-23 DIAGNOSIS — M2042 Other hammer toe(s) (acquired), left foot: Secondary | ICD-10-CM | POA: Diagnosis not present

## 2021-08-23 DIAGNOSIS — E119 Type 2 diabetes mellitus without complications: Secondary | ICD-10-CM | POA: Diagnosis not present

## 2021-08-23 DIAGNOSIS — K2100 Gastro-esophageal reflux disease with esophagitis, without bleeding: Secondary | ICD-10-CM | POA: Diagnosis not present

## 2021-08-23 DIAGNOSIS — I10 Essential (primary) hypertension: Secondary | ICD-10-CM | POA: Diagnosis not present

## 2021-09-05 NOTE — Progress Notes (Signed)
DUE TO COVID-19 ONLY ONE VISITOR IS ALLOWED TO COME WITH YOU AND STAY IN THE WAITING ROOM ONLY DURING PRE OP AND PROCEDURE DAY OF SURGERY.  2 VISITOR  MAY VISIT WITH YOU AFTER SURGERY IN YOUR PRIVATE ROOM DURING VISITING HOURS ONLY!  YOU NEED TO HAVE A COVID 19 TEST ON__12/06/2021 _ @_from  8am-3pm _____, THIS TEST MUST BE DONE BEFORE SURGERY,  Covid test is done at [address] [place], [place] [address].  This is a drive thru.  No appt required. Please see map.                 Your procedure is scheduled on:  09/12/2021   Report to [facility] Main  Entrance   Report to admitting at   [phone]     Call this number if you have problems the morning of surgery [phone]    REMEMBER: NO  SOLID FOOD CANDY OR GUM AFTER MIDNIGHT. CLEAR LIQUIDS UNTIL   0430am       . NOTHING BY MOUTH EXCEPT CLEAR LIQUIDS UNTIL 0431am    . PLEASE FINISH ENSURE DRINK PER SURGEON ORDER  WHICH NEEDS TO BE COMPLETED AT  0430am     .      CLEAR LIQUID DIET   Foods Allowed                                                                    Coffee and tea, regular and decaf                            Fruit ices (not with fruit pulp)                                      Iced Popsicles                                    Carbonated beverages, regular and diet                                    Cranberry, grape and apple juices Sports drinks like Gatorade Lightly seasoned clear broth or consume(fat free) Sugar, honey syrup ___________________________________________________________________      BRUSH YOUR TEETH MORNING OF SURGERY AND RINSE YOUR MOUTH OUT, NO CHEWING GUM CANDY OR MINTS.     Take these medicines the morning of surgery with A SIP OF WATER: flonase if needed, omeprazole, lyrica   DO NOT TAKE ANY DIABETIC MEDICATIONS DAY OF YOUR SURGERY                               You may not have any metal on your body including hair pins and              piercings  Do not wear jewelry, make-up,  lotions, powders or perfumes, deodorant             Do not  wear nail polish on your fingernails.  Do not shave  48 hours prior to surgery.              Men may shave face and neck.   Do not bring valuables to the hospital. [place].  Contacts, dentures or bridgework may not be worn into surgery.  Leave suitcase in the car. After surgery it may be brought to your room.     Patients discharged the day of surgery will not be allowed to drive home. IF YOU ARE HAVING SURGERY AND GOING HOME THE SAME DAY, YOU MUST HAVE AN ADULT TO DRIVE YOU HOME AND BE WITH YOU FOR 24 HOURS. YOU MAY GO HOME BY TAXI OR UBER OR ORTHERWISE, BUT AN ADULT MUST ACCOMPANY YOU HOME AND STAY WITH YOU FOR 24 HOURS.  Name and phone number of your driver:  Special Instructions: N/A              Please read over the following fact sheets you were given: _____________________________________________________________________  [facility] - Preparing for Surgery Before surgery, you can play an important role.  Because skin is not sterile, your skin needs to be as free of germs as possible.  You can reduce the number of germs on your skin by washing with CHG (chlorahexidine gluconate) soap before surgery.  CHG is an antiseptic cleaner which kills germs and bonds with the skin to continue killing germs even after washing. Please DO NOT use if you have an allergy to CHG or antibacterial soaps.  If your skin becomes reddened/irritated stop using the CHG and inform your nurse when you arrive at Short Stay. Do not shave (including legs and underarms) for at least 48 hours prior to the first CHG shower.  You may shave your face/neck. Please follow these instructions carefully:  1.  Shower with CHG Soap the night before surgery and the  morning of Surgery.  2.  If you choose to wash your hair, wash your hair first as usual with your  normal  shampoo.  3.  After you shampoo, rinse your hair  and body thoroughly to remove the  shampoo.                           4.  Use CHG as you would any other liquid soap.  You can apply chg directly  to the skin and wash                       Gently with a scrungie or clean washcloth.  5.  Apply the CHG Soap to your body ONLY FROM THE NECK DOWN.   Do not use on face/ open                           Wound or open sores. Avoid contact with eyes, ears mouth and genitals (private parts).                       Wash face,  Genitals (private parts) with your normal soap.             6.  Wash thoroughly, paying special attention to the area where your surgery  will be performed.  7.  Thoroughly rinse your body with warm water  from the neck down.  8.  DO NOT shower/wash with your normal soap after using and rinsing off  the CHG Soap.                9.  Pat yourself dry with a clean towel.            10.  Wear clean pajamas.            11.  Place clean sheets on your bed the night of your first shower and do not  sleep with pets. Day of Surgery : Do not apply any lotions/deodorants the morning of surgery.  Please wear clean clothes to the hospital/surgery center.  FAILURE TO FOLLOW THESE INSTRUCTIONS MAY RESULT IN THE CANCELLATION OF YOUR SURGERY PATIENT SIGNATURE_________________________________  NURSE SIGNATURE__________________________________  ________________________________________________________________________

## 2021-09-06 ENCOUNTER — Other Ambulatory Visit: Payer: Self-pay

## 2021-09-06 ENCOUNTER — Encounter (HOSPITAL_COMMUNITY): Payer: Self-pay

## 2021-09-06 ENCOUNTER — Encounter (HOSPITAL_COMMUNITY)
Admission: RE | Admit: 2021-09-06 | Discharge: 2021-09-06 | Disposition: A | Discharge status: Home / Self Care | Payer: BC Managed Care – PPO | Source: Ambulatory Visit | Attending: General Surgery | Admitting: General Surgery

## 2021-09-06 DIAGNOSIS — Z01812 Encounter for preprocedural laboratory examination: Secondary | ICD-10-CM | POA: Diagnosis not present

## 2021-09-06 HISTORY — DX: Type 2 diabetes mellitus without complications: E11.9

## 2021-09-06 HISTORY — DX: Unspecified osteoarthritis, unspecified site: M19.90

## 2021-09-06 HISTORY — DX: Gastro-esophageal reflux disease without esophagitis: K21.9

## 2021-09-06 HISTORY — DX: Pneumonia, unspecified organism: J18.9

## 2021-09-06 LAB — CBC WITH DIFFERENTIAL/PLATELET
Abs Immature Granulocytes: 0.02 10*3/uL (ref 0.00–0.07)
Basophils Absolute: 0.1 10*3/uL (ref 0.0–0.1)
Basophils Relative: 1 %
Eosinophils Absolute: 0.1 10*3/uL (ref 0.0–0.5)
Eosinophils Relative: 2 %
HCT: 44.8 % (ref 36.0–46.0)
Hemoglobin: 14.8 g/dL (ref 12.0–15.0)
Immature Granulocytes: 0 %
Lymphocytes Relative: 36 %
Lymphs Abs: 2.2 10*3/uL (ref 0.7–4.0)
MCH: 31.2 pg (ref 26.0–34.0)
MCHC: 33 g/dL (ref 30.0–36.0)
MCV: 94.3 fL (ref 80.0–100.0)
Monocytes Absolute: 0.4 10*3/uL (ref 0.1–1.0)
Monocytes Relative: 7 %
Neutro Abs: 3.3 10*3/uL (ref 1.7–7.7)
Neutrophils Relative %: 54 %
Platelets: 307 10*3/uL (ref 150–400)
RBC: 4.75 MIL/uL (ref 3.87–5.11)
RDW: 13.7 % (ref 11.5–15.5)
WBC: 6.1 10*3/uL (ref 4.0–10.5)
nRBC: 0 % (ref 0.0–0.2)

## 2021-09-06 LAB — HEMOGLOBIN A1C
Hgb A1c MFr Bld: 6 % — ABNORMAL HIGH (ref 4.8–5.6)
Mean Plasma Glucose: 125.5 mg/dL

## 2021-09-06 LAB — COMPREHENSIVE METABOLIC PANEL
ALT: 21 U/L (ref 0–44)
AST: 21 U/L (ref 15–41)
Albumin: 4.5 g/dL (ref 3.5–5.0)
Alkaline Phosphatase: 55 U/L (ref 38–126)
Anion gap: 10 (ref 5–15)
BUN: 15 mg/dL (ref 6–20)
CO2: 29 mmol/L (ref 22–32)
Calcium: 9.1 mg/dL (ref 8.9–10.3)
Chloride: 98 mmol/L (ref 98–111)
Creatinine, Ser: 0.61 mg/dL (ref 0.44–1.00)
GFR, Estimated: >60 mL/min (ref >60)
Glucose, Bld: 109 mg/dL — ABNORMAL HIGH (ref 70–99)
Potassium: 3.7 mmol/L (ref 3.5–5.1)
Sodium: 137 mmol/L (ref 135–145)
Total Bilirubin: 0.4 mg/dL (ref 0.3–1.2)
Total Protein: 7.9 g/dL (ref 6.5–8.1)

## 2021-09-06 LAB — GLUCOSE, CAPILLARY: Glucose-Capillary: 98 mg/dL (ref 70–99)

## 2021-09-06 NOTE — Progress Notes (Addendum)
Anesthesia Review:  PCP: Lonie Peak in [place]  Cardiologist : none  Chest x-ray :07/10/21  EKG :07/10/21  Echo : Stress test: Cardiac Cath :  Activity level: can do a flight of stairs without difficulty  Sleep Study/ CPAP : none  Fasting Blood Sugar :      / Checks Blood Sugar -- times a day:   Blood Thinner/ Instructions /Last Dose: ASA / Instructions/ Last Dose :   DM- type 2 checks glucose daily  Hgba1c- 09/06/21- 6.0 Covid test on 09/08/2021

## 2021-09-08 ENCOUNTER — Other Ambulatory Visit: Payer: Self-pay | Admitting: General Surgery

## 2021-09-08 LAB — SARS CORONAVIRUS 2 (TAT 6-24 HRS): SARS Coronavirus 2: NEGATIVE

## 2021-09-11 NOTE — Anesthesia Preprocedure Evaluation (Addendum)
Anesthesia Evaluation  Patient identified by MRN, date of birth, ID band Patient awake    Reviewed: Allergy & Precautions, H&P , NPO status , Patient's Chart, lab work & pertinent test results  Airway Mallampati: II  TM Distance: >3 FB Neck ROM: Full    Dental no notable dental hx.    Pulmonary neg pulmonary ROS, former smoker,    Pulmonary exam normal breath sounds clear to auscultation       Cardiovascular hypertension, Pt. on medications Normal cardiovascular exam Rhythm:Regular Rate:Normal  EKG 07/2021: NSR   Neuro/Psych negative neurological ROS  negative psych ROS   GI/Hepatic Neg liver ROS, GERD  ,  Endo/Other  diabetes, Well Controlled, Type obesity  Renal/GU negative Renal ROS  negative genitourinary   Musculoskeletal  (+) Arthritis ,   Abdominal   Peds negative pediatric ROS (+)  Hematology negative hematology ROS (+)   Anesthesia Other Findings   Reproductive/Obstetrics negative OB ROS                            Anesthesia Physical Anesthesia Plan  ASA: 3  Anesthesia Plan: General   Post-op Pain Management: Tylenol PO (pre-op)   Induction:   PONV Risk Score and Plan: 3 and Treatment may vary due to age or medical condition, Midazolam, Dexamethasone, Ondansetron and Scopolamine patch - Pre-op  Airway Management Planned: Oral ETT  Additional Equipment:   Intra-op Plan:   Post-operative Plan: Extubation in OR  Informed Consent: I have reviewed the patients History and Physical, chart, labs and discussed the procedure including the risks, benefits and alternatives for the proposed anesthesia with the patient or authorized representative who has indicated his/her understanding and acceptance.     Dental advisory given  Plan Discussed with: Anesthesiologist and CRNA  Anesthesia Plan Comments:        Anesthesia Quick Evaluation

## 2021-09-12 ENCOUNTER — Other Ambulatory Visit: Payer: Self-pay

## 2021-09-12 ENCOUNTER — Inpatient Hospital Stay (HOSPITAL_COMMUNITY)
Admission: RE | Admit: 2021-09-12 | Discharge: 2021-09-13 | DRG: 621 | Disposition: A | Discharge status: Home / Self Care | Payer: BC Managed Care – PPO | Source: Ambulatory Visit | Attending: General Surgery | Admitting: General Surgery

## 2021-09-12 ENCOUNTER — Inpatient Hospital Stay (HOSPITAL_COMMUNITY): Payer: BC Managed Care – PPO | Admitting: Physician Assistant

## 2021-09-12 ENCOUNTER — Encounter (HOSPITAL_COMMUNITY)
Admission: RE | Disposition: A | Discharge status: Home / Self Care | Payer: Self-pay | Source: Ambulatory Visit | Attending: General Surgery

## 2021-09-12 ENCOUNTER — Inpatient Hospital Stay (HOSPITAL_COMMUNITY): Payer: BC Managed Care – PPO | Admitting: Anesthesiology

## 2021-09-12 ENCOUNTER — Encounter (HOSPITAL_COMMUNITY): Payer: Self-pay | Admitting: General Surgery

## 2021-09-12 DIAGNOSIS — E119 Type 2 diabetes mellitus without complications: Secondary | ICD-10-CM | POA: Diagnosis not present

## 2021-09-12 DIAGNOSIS — K2100 Gastro-esophageal reflux disease with esophagitis, without bleeding: Secondary | ICD-10-CM | POA: Diagnosis present

## 2021-09-12 DIAGNOSIS — I10 Essential (primary) hypertension: Secondary | ICD-10-CM | POA: Diagnosis present

## 2021-09-12 DIAGNOSIS — M1990 Unspecified osteoarthritis, unspecified site: Secondary | ICD-10-CM | POA: Diagnosis not present

## 2021-09-12 DIAGNOSIS — Z79899 Other long term (current) drug therapy: Secondary | ICD-10-CM | POA: Diagnosis not present

## 2021-09-12 DIAGNOSIS — Z87891 Personal history of nicotine dependence: Secondary | ICD-10-CM

## 2021-09-12 DIAGNOSIS — Z6841 Body Mass Index (BMI) 40.0 and over, adult: Secondary | ICD-10-CM

## 2021-09-12 DIAGNOSIS — Z79890 Hormone replacement therapy (postmenopausal): Secondary | ICD-10-CM | POA: Diagnosis not present

## 2021-09-12 DIAGNOSIS — Z8249 Family history of ischemic heart disease and other diseases of the circulatory system: Secondary | ICD-10-CM

## 2021-09-12 DIAGNOSIS — Z7984 Long term (current) use of oral hypoglycemic drugs: Secondary | ICD-10-CM

## 2021-09-12 DIAGNOSIS — Z90710 Acquired absence of both cervix and uterus: Secondary | ICD-10-CM

## 2021-09-12 HISTORY — PX: UPPER GI ENDOSCOPY: SHX6162

## 2021-09-12 HISTORY — PX: LAPAROSCOPIC GASTRIC SLEEVE RESECTION: SHX5895

## 2021-09-12 LAB — TYPE AND SCREEN
ABO/RH(D): A POS
Antibody Screen: NEGATIVE

## 2021-09-12 LAB — ABO/RH: ABO/RH(D): A POS

## 2021-09-12 LAB — HEMOGLOBIN AND HEMATOCRIT, BLOOD
HCT: 43 % (ref 36.0–46.0)
Hemoglobin: 13.9 g/dL (ref 12.0–15.0)

## 2021-09-12 LAB — GLUCOSE, CAPILLARY: Glucose-Capillary: 128 mg/dL — ABNORMAL HIGH (ref 70–99)

## 2021-09-12 SURGERY — GASTRECTOMY, SLEEVE, LAPAROSCOPIC
Anesthesia: General | Site: Abdomen

## 2021-09-12 MED ORDER — DEXTROSE-NACL 5-0.45 % IV SOLN: INTRAVENOUS | Status: DC

## 2021-09-12 MED ORDER — HYDRALAZINE HCL 10 MG PO TABS
10.0000 mg | ORAL_TABLET | Freq: Three times a day (TID) | ORAL | Status: DC | PRN
  Filled 2021-09-12: qty 1

## 2021-09-12 MED ORDER — ONDANSETRON HCL 4 MG/2ML IJ SOLN
INTRAMUSCULAR | Status: DC | PRN
  Administered 2021-09-12: 4 mg via INTRAVENOUS

## 2021-09-12 MED ORDER — LACTATED RINGERS IR SOLN
Status: DC | PRN
  Administered 2021-09-12: 3000 mL

## 2021-09-12 MED ORDER — KETAMINE HCL-SODIUM CHLORIDE 100-0.9 MG/10ML-% IV SOSY
PREFILLED_SYRINGE | INTRAVENOUS | Status: AC
  Filled 2021-09-12: qty 10

## 2021-09-12 MED ORDER — PHENYLEPHRINE HCL-NACL 20-0.9 MG/250ML-% IV SOLN
INTRAVENOUS | Status: DC | PRN
  Administered 2021-09-12: 35 ug/min via INTRAVENOUS

## 2021-09-12 MED ORDER — HYDRALAZINE HCL 20 MG/ML IJ SOLN
10.0000 mg | Freq: Once | INTRAMUSCULAR | Status: AC
  Administered 2021-09-12: 10 mg via INTRAVENOUS

## 2021-09-12 MED ORDER — FENTANYL CITRATE (PF) 250 MCG/5ML IJ SOLN
INTRAMUSCULAR | Status: DC | PRN
  Administered 2021-09-12: 50 ug via INTRAVENOUS
  Administered 2021-09-12: 100 ug via INTRAVENOUS

## 2021-09-12 MED ORDER — LIDOCAINE 2% (20 MG/ML) 5 ML SYRINGE
INTRAMUSCULAR | Status: DC | PRN
  Administered 2021-09-12: 80 mg via INTRAVENOUS

## 2021-09-12 MED ORDER — PHENYLEPHRINE HCL (PRESSORS) 10 MG/ML IV SOLN
INTRAVENOUS | Status: AC
  Filled 2021-09-12: qty 1

## 2021-09-12 MED ORDER — DROPERIDOL 2.5 MG/ML IJ SOLN: 0.6250 mg | Freq: Once | INTRAMUSCULAR | Status: DC | PRN

## 2021-09-12 MED ORDER — ROCURONIUM BROMIDE 10 MG/ML (PF) SYRINGE
PREFILLED_SYRINGE | INTRAVENOUS | Status: AC
  Filled 2021-09-12: qty 10

## 2021-09-12 MED ORDER — SUGAMMADEX SODIUM 500 MG/5ML IV SOLN
INTRAVENOUS | Status: AC
  Filled 2021-09-12: qty 5

## 2021-09-12 MED ORDER — ENSURE MAX PROTEIN PO LIQD
2.0000 [oz_av] | ORAL | Status: DC
  Administered 2021-09-13 (×3): 2 [oz_av] via ORAL

## 2021-09-12 MED ORDER — FAMOTIDINE IN NACL 20-0.9 MG/50ML-% IV SOLN
20.0000 mg | Freq: Two times a day (BID) | INTRAVENOUS | Status: DC
  Administered 2021-09-12 – 2021-09-13 (×3): 20 mg via INTRAVENOUS
  Filled 2021-09-12 (×3): qty 50

## 2021-09-12 MED ORDER — LIDOCAINE HCL (PF) 2 % IJ SOLN
INTRAMUSCULAR | Status: AC
  Filled 2021-09-12: qty 5

## 2021-09-12 MED ORDER — ACETAMINOPHEN 160 MG/5ML PO SOLN
1000.0000 mg | Freq: Three times a day (TID) | ORAL | Status: DC
  Administered 2021-09-12: 1000 mg via ORAL
  Filled 2021-09-12: qty 40.6

## 2021-09-12 MED ORDER — EPHEDRINE 5 MG/ML INJ
INTRAVENOUS | Status: AC
  Filled 2021-09-12: qty 5

## 2021-09-12 MED ORDER — MORPHINE SULFATE (PF) 2 MG/ML IV SOLN: 1.0000 mg | INTRAVENOUS | Status: DC | PRN

## 2021-09-12 MED ORDER — ONDANSETRON HCL 4 MG/2ML IJ SOLN
INTRAMUSCULAR | Status: AC
  Filled 2021-09-12: qty 2

## 2021-09-12 MED ORDER — SIMETHICONE 80 MG PO CHEW: 80.0000 mg | CHEWABLE_TABLET | Freq: Four times a day (QID) | ORAL | Status: DC | PRN

## 2021-09-12 MED ORDER — SUGAMMADEX SODIUM 500 MG/5ML IV SOLN
INTRAVENOUS | Status: DC | PRN
  Administered 2021-09-12: 300 mg via INTRAVENOUS

## 2021-09-12 MED ORDER — EPHEDRINE SULFATE-NACL 50-0.9 MG/10ML-% IV SOSY
PREFILLED_SYRINGE | INTRAVENOUS | Status: DC | PRN
  Administered 2021-09-12: 10 mg via INTRAVENOUS

## 2021-09-12 MED ORDER — ENOXAPARIN SODIUM 30 MG/0.3ML IJ SOSY
30.0000 mg | PREFILLED_SYRINGE | Freq: Two times a day (BID) | INTRAMUSCULAR | Status: DC
  Administered 2021-09-12 – 2021-09-13 (×2): 30 mg via SUBCUTANEOUS
  Filled 2021-09-12 (×2): qty 0.3

## 2021-09-12 MED ORDER — LIDOCAINE 2% (20 MG/ML) 5 ML SYRINGE
INTRAMUSCULAR | Status: DC | PRN
  Administered 2021-09-12: 1.5 mg/kg/h via INTRAVENOUS

## 2021-09-12 MED ORDER — ONDANSETRON HCL 4 MG/2ML IJ SOLN: 4.0000 mg | INTRAMUSCULAR | Status: DC | PRN

## 2021-09-12 MED ORDER — BUPIVACAINE HCL 0.25 % IJ SOLN
INTRAMUSCULAR | Status: AC
  Filled 2021-09-12: qty 1

## 2021-09-12 MED ORDER — MIDAZOLAM HCL 5 MG/5ML IJ SOLN
INTRAMUSCULAR | Status: DC | PRN
  Administered 2021-09-12: 2 mg via INTRAVENOUS

## 2021-09-12 MED ORDER — BUPIVACAINE LIPOSOME 1.3 % IJ SUSP: 20.0000 mL | Freq: Once | INTRAMUSCULAR | Status: DC

## 2021-09-12 MED ORDER — BUPIVACAINE HCL 0.25 % IJ SOLN
INTRAMUSCULAR | Status: DC | PRN
  Administered 2021-09-12: 50 mL

## 2021-09-12 MED ORDER — DEXAMETHASONE SODIUM PHOSPHATE 4 MG/ML IJ SOLN: 4.0000 mg | INTRAMUSCULAR | Status: DC

## 2021-09-12 MED ORDER — ROCURONIUM BROMIDE 10 MG/ML (PF) SYRINGE
PREFILLED_SYRINGE | INTRAVENOUS | Status: DC | PRN
  Administered 2021-09-12: 80 mg via INTRAVENOUS

## 2021-09-12 MED ORDER — SODIUM CHLORIDE 0.9 % IV SOLN
2.0000 g | INTRAVENOUS | Status: AC
  Administered 2021-09-12: 2 g via INTRAVENOUS
  Filled 2021-09-12: qty 2

## 2021-09-12 MED ORDER — KETAMINE HCL 10 MG/ML IJ SOLN
INTRAMUSCULAR | Status: DC | PRN
  Administered 2021-09-12: 50 mg via INTRAVENOUS

## 2021-09-12 MED ORDER — LIDOCAINE HCL 2 % IJ SOLN
INTRAMUSCULAR | Status: AC
  Filled 2021-09-12: qty 20

## 2021-09-12 MED ORDER — LACTATED RINGERS IV SOLN: INTRAVENOUS | Status: DC

## 2021-09-12 MED ORDER — FENTANYL CITRATE PF 50 MCG/ML IJ SOSY
PREFILLED_SYRINGE | INTRAMUSCULAR | Status: AC
  Filled 2021-09-12: qty 2

## 2021-09-12 MED ORDER — PROPOFOL 10 MG/ML IV BOLUS
INTRAVENOUS | Status: DC | PRN
  Administered 2021-09-12: 150 mg via INTRAVENOUS

## 2021-09-12 MED ORDER — HYDRALAZINE HCL 20 MG/ML IJ SOLN
INTRAMUSCULAR | Status: AC
  Filled 2021-09-12: qty 1

## 2021-09-12 MED ORDER — ACETAMINOPHEN 500 MG PO TABS
1000.0000 mg | ORAL_TABLET | ORAL | Status: AC
  Administered 2021-09-12: 1000 mg via ORAL
  Filled 2021-09-12: qty 2

## 2021-09-12 MED ORDER — PROPOFOL 10 MG/ML IV BOLUS
INTRAVENOUS | Status: AC
  Filled 2021-09-12: qty 20

## 2021-09-12 MED ORDER — ACETAMINOPHEN 500 MG PO TABS
1000.0000 mg | ORAL_TABLET | Freq: Three times a day (TID) | ORAL | Status: DC
  Administered 2021-09-12 – 2021-09-13 (×2): 1000 mg via ORAL
  Filled 2021-09-12 (×2): qty 2

## 2021-09-12 MED ORDER — SCOPOLAMINE 1 MG/3DAYS TD PT72
1.0000 | MEDICATED_PATCH | TRANSDERMAL | Status: DC
  Administered 2021-09-12: 1.5 mg via TRANSDERMAL
  Filled 2021-09-12: qty 1

## 2021-09-12 MED ORDER — BUPIVACAINE LIPOSOME 1.3 % IJ SUSP
INTRAMUSCULAR | Status: AC
  Filled 2021-09-12: qty 20

## 2021-09-12 MED ORDER — PROMETHAZINE HCL 25 MG/ML IJ SOLN: 6.2500 mg | INTRAMUSCULAR | Status: DC | PRN

## 2021-09-12 MED ORDER — PHENYLEPHRINE 40 MCG/ML (10ML) SYRINGE FOR IV PUSH (FOR BLOOD PRESSURE SUPPORT)
PREFILLED_SYRINGE | INTRAVENOUS | Status: DC | PRN
  Administered 2021-09-12: 120 ug via INTRAVENOUS
  Administered 2021-09-12: 80 ug via INTRAVENOUS

## 2021-09-12 MED ORDER — BUPIVACAINE LIPOSOME 1.3 % IJ SUSP
INTRAMUSCULAR | Status: DC | PRN
  Administered 2021-09-12: 20 mL

## 2021-09-12 MED ORDER — HEPARIN SODIUM (PORCINE) 5000 UNIT/ML IJ SOLN
5000.0000 [IU] | INTRAMUSCULAR | Status: AC
  Administered 2021-09-12: 5000 [IU] via SUBCUTANEOUS
  Filled 2021-09-12: qty 1

## 2021-09-12 MED ORDER — PHENYLEPHRINE 40 MCG/ML (10ML) SYRINGE FOR IV PUSH (FOR BLOOD PRESSURE SUPPORT)
PREFILLED_SYRINGE | INTRAVENOUS | Status: AC
  Filled 2021-09-12: qty 10

## 2021-09-12 MED ORDER — HYDROCODONE-ACETAMINOPHEN 7.5-325 MG/15ML PO SOLN
5.0000 mL | ORAL | Status: DC | PRN
  Administered 2021-09-12: 7.5 mL via ORAL
  Administered 2021-09-12: 5 mL via ORAL
  Administered 2021-09-13: 10:00:00 7.5 mL via ORAL
  Administered 2021-09-13: 04:00:00 5 mL via ORAL
  Filled 2021-09-12 (×4): qty 15

## 2021-09-12 MED ORDER — FENTANYL CITRATE (PF) 250 MCG/5ML IJ SOLN
INTRAMUSCULAR | Status: AC
  Filled 2021-09-12: qty 5

## 2021-09-12 MED ORDER — DEXAMETHASONE SODIUM PHOSPHATE 10 MG/ML IJ SOLN
INTRAMUSCULAR | Status: DC | PRN
  Administered 2021-09-12: 6 mg via INTRAVENOUS

## 2021-09-12 MED ORDER — FENTANYL CITRATE PF 50 MCG/ML IJ SOSY
25.0000 ug | PREFILLED_SYRINGE | INTRAMUSCULAR | Status: DC | PRN
  Administered 2021-09-12: 25 ug via INTRAVENOUS

## 2021-09-12 MED ORDER — CHLORHEXIDINE GLUCONATE CLOTH 2 % EX PADS: 6.0000 | MEDICATED_PAD | Freq: Once | CUTANEOUS | Status: DC

## 2021-09-12 MED ORDER — MIDAZOLAM HCL 2 MG/2ML IJ SOLN
INTRAMUSCULAR | Status: AC
  Filled 2021-09-12: qty 2

## 2021-09-12 MED ORDER — APREPITANT 40 MG PO CAPS
40.0000 mg | ORAL_CAPSULE | ORAL | Status: AC
  Administered 2021-09-12: 40 mg via ORAL
  Filled 2021-09-12: qty 1

## 2021-09-12 MED ORDER — DEXAMETHASONE SODIUM PHOSPHATE 10 MG/ML IJ SOLN
INTRAMUSCULAR | Status: AC
  Filled 2021-09-12: qty 1

## 2021-09-12 SURGICAL SUPPLY — 68 items
APL PRP STRL LF DISP 70% ISPRP (MISCELLANEOUS) ×2
APL SKNCLS STERI-STRIP NONHPOA (GAUZE/BANDAGES/DRESSINGS)
APPLIER CLIP ROT 13.4 12 LRG (CLIP)
APR CLP LRG 13.4X12 ROT 20 MLT (CLIP)
BAG COUNTER SPONGE SURGICOUNT (BAG) IMPLANT
BAG LAPAROSCOPIC 12 15 PORT 16 (BASKET) ×2 IMPLANT
BAG RETRIEVAL 12/15 (BASKET) ×3
BAG RETRIEVAL 12/15MM (BASKET) ×1
BAG SPNG CNTER NS LX DISP (BAG)
BAG SURGICOUNT SPONGE COUNTING (BAG)
BENZOIN TINCTURE PRP APPL 2/3 (GAUZE/BANDAGES/DRESSINGS) ×2 IMPLANT
BLADE SURG SZ11 CARB STEEL (BLADE) ×4 IMPLANT
BNDG ADH 1X3 SHEER STRL LF (GAUZE/BANDAGES/DRESSINGS) ×24 IMPLANT
BNDG ADH THN 3X1 STRL LF (GAUZE/BANDAGES/DRESSINGS) ×12
CABLE HIGH FREQUENCY MONO STRZ (ELECTRODE) IMPLANT
CHLORAPREP W/TINT 26 (MISCELLANEOUS) ×4 IMPLANT
CLIP APPLIE ROT 13.4 12 LRG (CLIP) IMPLANT
CLOSURE STERI-STRIP 1/2X4 (GAUZE/BANDAGES/DRESSINGS) ×1
CLOSURE WOUND 1/2 X4 (GAUZE/BANDAGES/DRESSINGS) ×1
CLSR STERI-STRIP ANTIMIC 1/2X4 (GAUZE/BANDAGES/DRESSINGS) ×1 IMPLANT
COVER SURGICAL LIGHT HANDLE (MISCELLANEOUS) ×4 IMPLANT
DECANTER SPIKE VIAL GLASS SM (MISCELLANEOUS) ×4 IMPLANT
DRAPE UTILITY XL STRL (DRAPES) ×8 IMPLANT
ELECT REM PT RETURN 15FT ADLT (MISCELLANEOUS) ×4 IMPLANT
GAUZE 4X4 16PLY ~~LOC~~+RFID DBL (SPONGE) ×4 IMPLANT
GLOVE SURG POLYISO LF SZ7 (GLOVE) ×4 IMPLANT
GLOVE SURG UNDER POLY LF SZ7 (GLOVE) ×4 IMPLANT
GOWN STRL REUS W/TWL LRG LVL3 (GOWN DISPOSABLE) ×4 IMPLANT
GOWN STRL REUS W/TWL XL LVL3 (GOWN DISPOSABLE) ×12 IMPLANT
GRASPER SUT TROCAR 14GX15 (MISCELLANEOUS) ×4 IMPLANT
IRRIG SUCT STRYKERFLOW 2 WTIP (MISCELLANEOUS) ×4
IRRIGATION SUCT STRKRFLW 2 WTP (MISCELLANEOUS) ×2 IMPLANT
KIT BASIN OR (CUSTOM PROCEDURE TRAY) ×4 IMPLANT
KIT TURNOVER KIT A (KITS) IMPLANT
MARKER SKIN DUAL TIP RULER LAB (MISCELLANEOUS) ×4 IMPLANT
MAT PREVALON FULL STRYKER (MISCELLANEOUS) IMPLANT
NDL SPNL 22GX3.5 QUINCKE BK (NEEDLE) ×2 IMPLANT
NEEDLE SPNL 22GX3.5 QUINCKE BK (NEEDLE) ×4 IMPLANT
PACK UNIVERSAL I (CUSTOM PROCEDURE TRAY) ×4 IMPLANT
RELOAD STAPLE 60 3.6 BLU REG (STAPLE) IMPLANT
RELOAD STAPLE 60 3.8 GOLD REG (STAPLE) IMPLANT
RELOAD STAPLE 60 4.1 GRN THCK (STAPLE) IMPLANT
RELOAD STAPLER BLUE 60MM (STAPLE) ×6 IMPLANT
RELOAD STAPLER GOLD 60MM (STAPLE) ×4 IMPLANT
RELOAD STAPLER GREEN 60MM (STAPLE) IMPLANT
SCISSORS LAP 5X45 EPIX DISP (ENDOMECHANICALS) IMPLANT
SET TUBE SMOKE EVAC HIGH FLOW (TUBING) ×4 IMPLANT
SHEARS HARMONIC ACE PLUS 45CM (MISCELLANEOUS) ×4 IMPLANT
SLEEVE GASTRECTOMY 40FR VISIGI (MISCELLANEOUS) ×4 IMPLANT
SLEEVE XCEL OPT CAN 5 100 (ENDOMECHANICALS) ×8 IMPLANT
SOL ANTI FOG 6CC (MISCELLANEOUS) ×2 IMPLANT
SOLUTION ANTI FOG 6CC (MISCELLANEOUS) ×2
STAPLER ECHELON LONG 60 440 (INSTRUMENTS) ×4 IMPLANT
STAPLER RELOAD BLUE 60MM (STAPLE) ×12
STAPLER RELOAD GOLD 60MM (STAPLE) ×8
STAPLER RELOAD GREEN 60MM (STAPLE)
STRIP CLOSURE SKIN 1/2X4 (GAUZE/BANDAGES/DRESSINGS) ×3 IMPLANT
SUT ETHIBOND 0 36 GRN (SUTURE) IMPLANT
SUT MNCRL AB 4-0 PS2 18 (SUTURE) ×4 IMPLANT
SUT VICRYL 0 TIES 12 18 (SUTURE) ×4 IMPLANT
SYR 20ML LL LF (SYRINGE) ×4 IMPLANT
SYR 50ML LL SCALE MARK (SYRINGE) ×4 IMPLANT
TOWEL OR 17X26 10 PK STRL BLUE (TOWEL DISPOSABLE) ×4 IMPLANT
TOWEL OR NON WOVEN STRL DISP B (DISPOSABLE) ×4 IMPLANT
TROCAR BLADELESS 15MM (ENDOMECHANICALS) ×4 IMPLANT
TROCAR BLADELESS OPT 5 100 (ENDOMECHANICALS) ×4 IMPLANT
TUBING CONNECTING 10 (TUBING) ×6 IMPLANT
TUBING CONNECTING 10' (TUBING) ×2

## 2021-09-12 NOTE — Progress Notes (Signed)
PHARMACY CONSULT FOR:  Risk Assessment for Post-Discharge VTE Following Bariatric Surgery  Post-Discharge VTE Risk Assessment: This patient's probability of 30-day post-discharge VTE is increased due to the factors marked:   Female    Age >/=60 years    BMI >/=50 kg/m2    CHF    Dyspnea at Rest    Paraplegia  X  Non-gastric-band surgery    Operation Time >/=3 hr    Return to OR     Length of Stay >/= 3 d   Hx of VTE   Hypercoagulable condition   Significant venous stasis   Predicted probability of 30-day post-discharge VTE: 0.16%, Mild  Recommendation for Discharge:  No pharmacologic prophylaxis post-discharge    Michelle Perry is a 55 y.o. female who underwent laparoscopic sleeve gastrectomy on 09/12/2021.     Case start: 0803 Case end: 0848   Allergies  Allergen Reactions   Percocet [Oxycodone-Acetaminophen]     vertigo    Patient Measurements: Height: 5\' 5"  (165.1 cm) Weight: 113.9 kg (251 lb 3.2 oz) IBW/kg (Calculated) : 57 Body mass index is 41.8 kg/m.  Recent Labs    09/12/21 0926  HGB 13.9  HCT 43.0   Estimated Creatinine Clearance: 102.5 mL/min (by C-G formula based on SCr of 0.61 mg/dL).    Past Medical History:  Diagnosis Date   Arthritis    Diabetes mellitus without complication (HCC)    GERD (gastroesophageal reflux disease)    Hypertension    Pneumonia      Medications Prior to Admission  Medication Sig Dispense Refill Last Dose   ACCU-CHEK GUIDE test strip USE 1 TO CHECK BLOOD GLUCOSE TWICE DAILY   09/11/2021   acetaminophen (TYLENOL) 500 MG tablet Take 1,000 mg by mouth every 6 (six) hours as needed for moderate pain. Max 6 tabs per 24 hours   Past Week   atorvastatin (LIPITOR) 10 MG tablet Take 10 mg by mouth at bedtime.  3 09/11/2021   Biotin w/ Vitamins C & E (HAIR SKIN & NAILS GUMMIES PO) Take 2 capsules by mouth daily.   Past Week   bismuth subsalicylate (PEPTO BISMOL) 262 MG/15ML suspension Take 30 mLs by mouth every 6 (six)  hours as needed for diarrhea or loose stools or indigestion.   Past Week   celecoxib (CELEBREX) 100 MG capsule Take 100 mg by mouth 2 (two) times daily.   Past Month   cholecalciferol (VITAMIN D3) 25 MCG (1000 UNIT) tablet Take 1,000 Units by mouth daily.   Past Week   ferrous sulfate 325 (65 FE) MG tablet Take 325 mg by mouth daily.   Past Week   fluticasone (FLONASE) 50 MCG/ACT nasal spray Place 2 sprays into both nostrils daily as needed for allergies or rhinitis.   Past Week   HYDROcodone-acetaminophen (NORCO/VICODIN) 5-325 MG tablet Take 0.5-1 tablets by mouth every 4 (four) hours as needed for moderate pain.   Past Week   Lidocaine-Menthol (ICY HOT MAX LIDOCAINE EX) Apply 1 application topically daily as needed (pain).   Past Month   LIDODERM 5 % 1 patch daily as needed (pain).   Past Month   losartan-hydrochlorothiazide (HYZAAR) 50-12.5 MG tablet Take 1 tablet by mouth daily.   09/11/2021   Lysine HCl 500 MG TABS Take 500 mg by mouth daily.   Past Month   Melatonin 1 MG CAPS Take 0.5 mg by mouth at bedtime.   09/11/2021   metFORMIN (GLUCOPHAGE) 500 MG tablet Take 500 mg by mouth daily.  09/11/2021   Misc Natural Products (OSTEO BI-FLEX ADV JOINT SHIELD) TABS Take 1 tablet by mouth daily.   Past Week   Multiple Vitamin (MULTIVITAMIN WITH MINERALS) TABS tablet Take 1 tablet by mouth daily.   Past Week   Omega-3 Fatty Acids (FISH OIL) 1200 MG CAPS Take 1,200 mg by mouth daily.   Past Week   omeprazole (PRILOSEC) 40 MG capsule Take 40 mg by mouth daily.   09/12/2021 at 0400   pregabalin (LYRICA) 50 MG capsule Take 50 mg by mouth 2 (two) times daily.   09/12/2021 at 0400   Probiotic Product (PROBIOTIC PO) Take 1 capsule by mouth daily.   Past Week   Sodium Hyaluronate, Viscosup, (EUFLEXXA IX) Inject 1 Dose into the articular space See admin instructions. Pt receives 1 dose every 6 to 12 months      tirzepatide [facility]) 7.5 MG/0.5ML Pen Inject 7.5 mg into the skin every Monday.   Past Week    tiZANidine (ZANAFLEX) 4 MG tablet Take 2-4 mg by mouth daily as needed for muscle spasms.   09/11/2021   estradiol (ESTRACE) 0.1 MG/GM vaginal cream 1 Applicatorful 2 (two) times a week.  11     Lynann Beaver PharmD, BCPS Clinical Pharmacist [facility] main pharmacy [phone] 09/12/2021 12:20 PM

## 2021-09-12 NOTE — Anesthesia Postprocedure Evaluation (Signed)
Anesthesia Post Note  Patient: Michelle Perry  Procedure(s) Performed: LAPAROSCOPIC GASTRIC SLEEVE RESECTION (Abdomen) UPPER GI ENDOSCOPY     Patient location during evaluation: PACU Anesthesia Type: General Level of consciousness: awake Pain management: pain level controlled Vital Signs Assessment: post-procedure vital signs reviewed and stable Respiratory status: spontaneous breathing and respiratory function stable Cardiovascular status: stable Postop Assessment: no apparent nausea or vomiting Anesthetic complications: no   No notable events documented.  Last Vitals:  Vitals:   09/12/21 1237 09/12/21 1325  BP: (!) 152/65 (!) 144/80  Pulse: 68 82  Resp: 16 18  Temp: 36.6 C 36.8 C  SpO2: 98% 97%    Last Pain:  Vitals:   09/12/21 1325  TempSrc: Oral  PainSc:                  Mellody Dance

## 2021-09-12 NOTE — Progress Notes (Signed)

## 2021-09-12 NOTE — Anesthesia Procedure Notes (Signed)
Procedure Name: Intubation Date/Time: 09/12/2021 7:39 AM Performed by: Maxwell Caul, CRNA Pre-anesthesia Checklist: Patient identified, Emergency Drugs available, Suction available and Patient being monitored Patient Re-evaluated:Patient Re-evaluated prior to induction Oxygen Delivery Method: Circle system utilized Preoxygenation: Pre-oxygenation with 100% oxygen Induction Type: IV induction Ventilation: Mask ventilation without difficulty Laryngoscope Size: Mac and 4 Grade View: Grade I Tube type: Oral Tube size: 7.5 mm Number of attempts: 1 Airway Equipment and Method: Stylet Placement Confirmation: ETT inserted through vocal cords under direct vision, positive ETCO2 and breath sounds checked- equal and bilateral Secured at: 21 cm Tube secured with: Tape Dental Injury: Teeth and Oropharynx as per pre-operative assessment

## 2021-09-12 NOTE — Transfer of Care (Signed)
Immediate Anesthesia Transfer of Care Note  Patient: ANNABELLE REXROAD  Procedure(s) Performed: LAPAROSCOPIC GASTRIC SLEEVE RESECTION (Abdomen) UPPER GI ENDOSCOPY  Patient Location: PACU  Anesthesia Type:General  Level of Consciousness: awake, alert  and oriented  Airway & Oxygen Therapy: Patient Spontanous Breathing and Patient connected to face mask oxygen  Post-op Assessment: Report given to RN and Post -op Vital signs reviewed and stable  Post vital signs: Reviewed and stable  Last Vitals:  Vitals Value Taken Time  BP    Temp    Pulse 64 09/12/21 0857  Resp    SpO2 98 % 09/12/21 0857  Vitals shown include unvalidated device data.  Last Pain:  Vitals:   09/12/21 0605  TempSrc: Oral         Complications: No notable events documented.

## 2021-09-12 NOTE — Op Note (Signed)
Preoperative diagnosis: laparoscopic sleeve gastrectomy  Postoperative diagnosis: Same   Procedure: Upper endoscopy   Surgeon: Shyanna Klingel A Hasson Gaspard, M.D.  Anesthesia: Gen.   Description of procedure: The endoscope was placed in the mouth and oropharynx and under endoscopic vision it was advanced to the esophagogastric junction which was identified at 36cm from the teeth.  The pouch was tensely insufflated while the upper abdomen was flooded with irrigation to perform a leak test, which was negative. No bubbles were seen.  The staple line was hemostatic and the lumen was evenly tubular without undue narrowing, angulation or twisting specifically at the incisura angularis. The lumen was decompressed and the scope was withdrawn without difficulty.    Michelle Perry A Braylie Badami, M.D. General, Bariatric, & Minimally Invasive Surgery [facility]    

## 2021-09-12 NOTE — Op Note (Signed)
**Note Michelle-Identified via Obfuscation** Preop Diagnosis: Obesity Class III  Postop Diagnosis: same  Procedure performed: laparoscopic Sleeve Gastrectomy  Assitant: Phylliss Blakes  Indications:  The patient is a 55 y.o. year-old morbidly obese female who has been followed in the Bariatric Clinic as an outpatient. This patient was diagnosed with morbid obesity with a BMI of Body mass index is 41.8 kg/m. and significant co-morbidities including hypertension, non-insulin dependent diabetes, and osteoarthritis.  The patient was counseled extensively in the Bariatric Outpatient Clinic and after a thorough explanation of the risks and benefits of surgery (including death from complications, bowel leak, infection such as peritonitis and/or sepsis, internal hernia, bleeding, need for blood transfusion, bowel obstruction, organ failure, pulmonary embolus, deep venous thrombosis, wound infection, incisional hernia, skin breakdown, and others entailed on the consent form) and after a compliant diet and exercise program, the patient was scheduled for an elective laparoscopic sleeve gastrectomy.  Description of Operation:  Following informed consent, the patient was taken to the operating room and placed on the operating table in the supine position.  She had previously received prophylactic antibiotics and subcutaneous heparin for DVT prophylaxis in the pre-op holding area.  After induction of general endotracheal anesthesia by the anesthesiologist, the patient underwent placement of sequential compression devices and an oro-gastric tube.  A timeout was confirmed by the surgery and anesthesia teams.  The patient was adequately padded at all pressure points and placed on a footboard to prevent slippage from the OR table during extremes of position during surgery.  She underwent a routine sterile prep and drape of her entire abdomen.    Next, A transverse incision was made under the left subcostal area and a 81mm optical viewing trocar was introduced into  the peritoneal cavity. Pneumoperitoneum was applied with a high flow and low pressure. A laparoscope was inserted to confirm placement. A extraperitoneal block was then placed at the lateral abdominal wall using exparel diluted with marcaine. 5 additional incisions were placed: 1 41mm trocar to the left of the midline. 1 additional 6mm trocar in the left lateral area, 1 35mm trocar in the right mid abdomen, 1 49mm trocar in the right subcostal area, and a Nathanson retractor was placed through a subxiphoid incision.  Next, a hole was created through the lesser omentum along the greater curve of the stomach to enter the lesser sac. The vessels along the greater omentum were  Then ligated and divided using the Harmonic scalpel moving towards the spleen and then short gastric vessels were ligated and divided in the same fashion to fully mobilize the fundus. The left crus was identified to ensure completion of the dissection. Next the antrum was measured and dissection continued inferiorly along the greater curve towards the pylorus and stopped 6cm from the pylorus.   A 40Fr ViSiGi dilator was placed into the esophgaus and along the lesser curve of the stomach and placed on suction. 2 59mm Gold load echelon stapler(s) followed by 3 3mm blue load echelon stapler(s) were used to make the resection along the antrum being sure to stay well away from the angularis by angling the jaws of the stapler towards the greater curve and later completing the resection staying along the ViSiGi and ensuring the fundus was not retained by appropriately retracting it lateral. Air was inserted through the ViSiGi to perform a leak test showing no bubbles and a neutral lie of the stomach.  The assistant then went and performed an upper endoscopy and leak test. No bubbles were seen and the sleeve  and antrum distended appropriately. The specimen was then placed in an endocatch bag and removed by the 76mm port. The fascia of the 4mm port  was closed with a 0 vicryl by suture passer. Hemostasis was ensured. Pneumoperitoneum was evacuated, all ports were removed and all incisions closed with 4-0 monocryl suture in subcuticular fashion. Steristrips and bandaids were put in place for dressing. The patient awoke from anesthesia and was brought to pacu in stable condition. All counts were correct.  Estimated blood loss: <24ml  Specimens:  Sleeve gastrectomy  Local Anesthesia: 70 ml Exparel:0.5% Marcaine mix  Post-Op Plan:       Pain Management: PO, prn      Antibiotics: Prophylactic      Anticoagulation: Prophylactic, Starting now      Post Op Studies/Consults: Not applicable      Intended Discharge: within 48h      Intended Outpatient Follow-Up: Two Week      Intended Outpatient Studies: Not Applicable      Other: Not Applicable  Michelle Perry

## 2021-09-12 NOTE — Progress Notes (Signed)
°  Transition of Care [facility]) Screening Note   Patient Details  Name: Michelle Perry Date of Birth: 1965/11/29   Transition of [facility]) CM/SW Contact:    Amada Jupiter, LCSW Phone Number: 09/12/2021, 1:05 PM    Transition of Care Department [facility]) has reviewed patient and no TOC needs have been identified at this time. We will continue to monitor patient advancement through interdisciplinary progression rounds. If new patient transition needs arise, please place a TOC consult.    Darsh Vandevoort, LCSW

## 2021-09-12 NOTE — H&P (Signed)
Chief Complaint: Weight Loss   History of Present Illness: Michelle Perry is a 55 y.o. female who is seen today for bariatric preop.  She had bunyon surgery last month and is on a scooter. She has no other new problems.  Review of Systems: A complete review of systems was obtained from the patient. I have reviewed this information and discussed as appropriate with the patient. See HPI as well for other ROS.  Review of Systems  Constitutional: Negative.  HENT: Negative.  Eyes: Negative.  Respiratory: Negative.  Cardiovascular: Negative.  Gastrointestinal: Negative.  Genitourinary: Negative.  Musculoskeletal: Negative.  Skin: Negative.  Neurological: Negative.  Endo/Heme/Allergies: Negative.  Psychiatric/Behavioral: Negative.    Medical History: Past Medical History:  Diagnosis Date   Diabetes mellitus without complication (CMS-HCC)   GERD (gastroesophageal reflux disease)   Hypertension   There is no problem list on file for this patient.  Past Surgical History:  Procedure Laterality Date   HYSTERECTOMY   left knee surgery Left   right carpal tunnel surgery   right shoulder surgery Right    No Known Allergies  Current Outpatient Medications on File Prior to Visit  Medication Sig Dispense Refill   atorvastatin (LIPITOR) 10 MG tablet TAKE 1 TABLET BY MOUTH EVERY DAY AT BEDTIME FOR CHOLESTEROL   blood glucose diagnostic (ACCU-CHEK GUIDE TEST STRIPS) test strip USE 1 TO CHECK BLOOD GLUCOSE TWICE DAILY   diclofenac (VOLTAREN) 75 MG EC tablet TAKE 1 (ONE) TABLET TWICE A DAY WITH FOOD FOR ARTHRITIS PAIN   doxycycline (VIBRA-TABS) 100 MG tablet Take 100 mg by mouth 2 (two) times daily   estradioL (ESTRACE) 0.01 % (0.1 mg/gram) vaginal cream Place vaginally every 7 (seven) days   fexofenadine (ALLEGRA) 180 MG tablet Take by mouth   fluconazole (DIFLUCAN) 200 MG tablet Take 200 mg by mouth once daily   HYDROcodone-acetaminophen (NORCO) 5-325 mg tablet Take 1 tablet by mouth  2 (two) times daily as needed   lidocaine (LIDODERM) 5 % patch APPLY 1 PATCH TO SKIN ONCE A DAY REMOVE AFTER 12 HOURS   metFORMIN (GLUCOPHAGE) 500 MG tablet TAKE 1 TABLET BY MOUTH EVERY DAY WITH FOOD FOR DIABETES   neomycin-polymyxin-hydrocortisone (CORTISPORIN) otic solution Apply 2-3 drops to the ingrown toenail site twice daily. Cover with band-aid.   omeprazole (PRILOSEC) 40 MG DR capsule TAKE 1 CAPSULE BY MOUTH EVERY DAY FOR ACID REFLUX   pregabalin (LYRICA) 50 MG capsule Take 50 mg by mouth 2 (two) times daily   tiZANidine (ZANAFLEX) 4 MG tablet Take 4 mg by mouth 2 (two) times daily as needed   No current facility-administered medications on file prior to visit.   Family History  Problem Relation Age of Onset   Skin cancer Father   High blood pressure (Hypertension) Father   Diabetes Father   Obesity Brother   High blood pressure (Hypertension) Brother   Diabetes Brother    Social History   Tobacco Use  Smoking Status Former  Smokeless Tobacco Never    Social History   Socioeconomic History   Marital status: Married  Tobacco Use   Smoking status: Former   Smokeless tobacco: Never  Building services engineer Use: Never used  Substance and Sexual Activity   Alcohol use: Yes   Drug use: Never   Objective:   Vitals:  08/23/21 1402  BP: 134/86  Pulse: 96  Temp: 36.4 C (97.5 F)  SpO2: 99%  Weight: (!) 122 kg (269 lb)  Height: 165.1 cm (5\' 5" )  Body mass index is 44.76 kg/m.  Physical Exam Constitutional:  Appearance: Normal appearance.  HENT:  Head: Normocephalic and atraumatic.  Pulmonary:  Effort: Pulmonary effort is normal.  Musculoskeletal:  General: Normal range of motion.  Cervical back: Normal range of motion.  Neurological:  General: No focal deficit present.  Mental Status: She is alert and oriented to person, place, and time. Mental status is at baseline.  Psychiatric:  Mood and Affect: Mood normal.  Behavior: Behavior normal.  Thought  Content: Thought content normal.     Assessment and Plan:  Diagnoses and all orders for this visit:  Morbid (severe) obesity due to excess calories (CMS-HCC)  Gastroesophageal reflux disease with esophagitis without hemorrhage  Type 2 diabetes mellitus without complication, without long-term current use of insulin (CMS-HCC)  Essential hypertension    The patient meets weight loss surgery criteria. Due to the above reasons, I think laparoscopic vertical sleeve gastrectomy is the best option for the patient.   We discussed LSG. We discussed the preoperative, operative and postoperative process. I explained the surgery in detail including the performance of an EGD near the end of the surgery to test for leak. We discussed the typical hospital course including a 1-2 day stay baring any complications. The patient was given educational material. I quoted the patient that most patients can lose up to 50-70% of their excess weight. We did discuss the possibility of weight regain several years after the procedure.   The risks of infection, bleeding, pain, scarring, weight regain, too little or too much weight loss, vitamin deficiencies and need for lifelong vitamin supplementation, hair loss, need for protein supplementation, leaks, stricture, reflux, food intolerance, gallstone formation, hernia, need for reoperation, need for open surgery, injury to spleen or surrounding structures, DVT's, PE, and death again discussed with the patient and the patient expressed understanding and desires to proceed with laparoscopic sleeve gastrectomy, possible open, intraoperative endoscopy.  We discussed that before and after surgery that there would be an alteration in their diet. I explained that we may put them on a diet 2 weeks before surgery. I also explained that they would be on a liquid diet for 2 weeks after surgery. We discussed that they would have to avoid certain foods after surgery. We discussed the  importance of physical activity as well as compliance with our dietary and supplement recommendations and routine follow-up.

## 2021-09-12 NOTE — Plan of Care (Signed)
Pt arrived to the floor and walked (with scooter) to the chair where she demonstrated correct use of the Incentive Spirometer reaching 1750. Patient started drinking water at noon.

## 2021-09-13 ENCOUNTER — Other Ambulatory Visit (HOSPITAL_COMMUNITY): Payer: Self-pay

## 2021-09-13 ENCOUNTER — Encounter (HOSPITAL_COMMUNITY): Payer: Self-pay | Admitting: General Surgery

## 2021-09-13 LAB — CBC WITH DIFFERENTIAL/PLATELET
Abs Immature Granulocytes: 0.03 10*3/uL (ref 0.00–0.07)
Basophils Absolute: 0 10*3/uL (ref 0.0–0.1)
Basophils Relative: 0 %
Eosinophils Absolute: 0 10*3/uL (ref 0.0–0.5)
Eosinophils Relative: 0 %
HCT: 35.3 % — ABNORMAL LOW (ref 36.0–46.0)
Hemoglobin: 11.8 g/dL — ABNORMAL LOW (ref 12.0–15.0)
Immature Granulocytes: 0 %
Lymphocytes Relative: 20 %
Lymphs Abs: 1.6 10*3/uL (ref 0.7–4.0)
MCH: 31.1 pg (ref 26.0–34.0)
MCHC: 33.4 g/dL (ref 30.0–36.0)
MCV: 92.9 fL (ref 80.0–100.0)
Monocytes Absolute: 0.6 10*3/uL (ref 0.1–1.0)
Monocytes Relative: 8 %
Neutro Abs: 5.7 10*3/uL (ref 1.7–7.7)
Neutrophils Relative %: 72 %
Platelets: 241 10*3/uL (ref 150–400)
RBC: 3.8 MIL/uL — ABNORMAL LOW (ref 3.87–5.11)
RDW: 13.9 % (ref 11.5–15.5)
WBC: 8 10*3/uL (ref 4.0–10.5)
nRBC: 0 % (ref 0.0–0.2)

## 2021-09-13 LAB — COMPREHENSIVE METABOLIC PANEL
ALT: 19 U/L (ref 0–44)
AST: 17 U/L (ref 15–41)
Albumin: 3.6 g/dL (ref 3.5–5.0)
Alkaline Phosphatase: 42 U/L (ref 38–126)
Anion gap: 8 (ref 5–15)
BUN: 8 mg/dL (ref 6–20)
CO2: 27 mmol/L (ref 22–32)
Calcium: 8.9 mg/dL (ref 8.9–10.3)
Chloride: 103 mmol/L (ref 98–111)
Creatinine, Ser: 0.49 mg/dL (ref 0.44–1.00)
GFR, Estimated: >60 mL/min (ref >60)
Glucose, Bld: 156 mg/dL — ABNORMAL HIGH (ref 70–99)
Potassium: 3.3 mmol/L — ABNORMAL LOW (ref 3.5–5.1)
Sodium: 138 mmol/L (ref 135–145)
Total Bilirubin: 0.5 mg/dL (ref 0.3–1.2)
Total Protein: 6.3 g/dL — ABNORMAL LOW (ref 6.5–8.1)

## 2021-09-13 LAB — SURGICAL PATHOLOGY

## 2021-09-13 MED ORDER — HYDROCODONE-ACETAMINOPHEN 5-325 MG PO TABS
1.0000 | ORAL_TABLET | ORAL | 0 refills | Status: DC | PRN
  Filled 2021-09-13: qty 20, 4d supply, fill #0

## 2021-09-13 MED ORDER — ONDANSETRON 4 MG PO TBDP
4.0000 mg | ORAL_TABLET | Freq: Four times a day (QID) | ORAL | 0 refills | Status: DC | PRN
  Filled 2021-09-13: qty 20, 5d supply, fill #0

## 2021-09-13 MED ORDER — POTASSIUM CHLORIDE 20 MEQ PO PACK
20.0000 meq | PACK | Freq: Two times a day (BID) | ORAL | Status: DC
  Administered 2021-09-13: 09:00:00 20 meq via ORAL
  Filled 2021-09-13: qty 1

## 2021-09-13 NOTE — Progress Notes (Signed)
Patient alert and oriented, pain is controlled. Patient is tolerating fluids, advanced to protein shake today, patient is tolerating well.  Reviewed Gastric sleeve discharge instructions with patient and patient is able to articulate understanding.  Instructed pt to review "Signs and Symptoms to Report" frequently due to recent hx of surgery prior to this admission and to call [facility] if any symptoms or concerns.  Provided information on BELT program, Support Group and [facility] outpatient pharmacy. All questions answered, will continue to monitor.

## 2021-09-13 NOTE — Plan of Care (Signed)
Problem: Education: ?Goal: Ability to state signs and symptoms to report to health care provider will improve ?Outcome: Completed/Met ?Goal: Knowledge of the prescribed self-care regimen will improve ?Outcome: Completed/Met ?Goal: Knowledge of discharge needs will improve ?Outcome: Completed/Met ?  ?Problem: Activity: ?Goal: Ability to tolerate increased activity will improve ?Outcome: Completed/Met ?  ?Problem: Bowel/Gastric: ?Goal: Gastrointestinal status for postoperative course will improve ?Outcome: Completed/Met ?Goal: Occurrences of nausea will decrease ?Outcome: Completed/Met ?  ?Problem: Coping: ?Goal: Development of coping mechanisms to deal with changes in body function or appearance will improve ?Outcome: Completed/Met ?  ?Problem: Fluid Volume: ?Goal: Maintenance of adequate hydration will improve ?Outcome: Completed/Met ?  ?Problem: Nutritional: ?Goal: Nutritional status will improve ?Outcome: Completed/Met ?  ?Problem: Clinical Measurements: ?Goal: Will show no signs or symptoms of venous thromboembolism ?Outcome: Completed/Met ?Goal: Will remain free from infection ?Outcome: Completed/Met ?Goal: Will show no signs of GI Leak ?Outcome: Completed/Met ?  ?Problem: Respiratory: ?Goal: Will regain and/or maintain adequate ventilation ?Outcome: Completed/Met ?  ?Problem: Pain Management: ?Goal: Pain level will decrease ?Outcome: Completed/Met ?  ?Problem: Skin Integrity: ?Goal: Demonstration of wound healing without infection will improve ?Outcome: Completed/Met ?  ?

## 2021-09-13 NOTE — Discharge Summary (Signed)
Physician Discharge Summary  Michelle Perry PNT:[id number] DOB: 14-Jul-1966 DOA: 09/12/2021  PCP: Lonie Peak, PA-C  Admit date: 09/12/2021 Discharge date: 09/13/2021  Recommendations for Outpatient Follow-up:   (include homehealth, outpatient follow-up instructions, specific recommendations for PCP to follow-up on, etc.)   Follow-up Information     Bayler Gehrig, De Blanch, MD. Go on 10/04/2021.   Specialty: General Surgery Why: at 10am.  Please arrive 15 minutes prior to your appointment time.  Thank you. Contact information: [address] [place] [address] [phone]         Surgery, [place]. Go on 11/03/2021.   Specialty: General Surgery Why: at 1:40pm with Dr. Sheliah Hatch.  Please arrive 15 minutes prior to your appointment time.  Thank you. Contact information: [address] [place] [address] [phone]                Discharge Diagnoses:  Principal Problem:   Morbid (severe) obesity due to excess calories [facility])   Surgical Procedure: laparoscopic sleeve gastrectomy, upper endoscopy  Discharge Condition: Good Disposition: Home  Diet recommendation: Postoperative sleeve gastrectomy diet (liquids only)  Filed Weights   09/12/21 0605  Weight: 113.9 kg     Hospital Course:  The patient was admitted after undergoing laparoscopic sleeve gastrectomy. POD 0 she ambulated well. POD 1 she was started on the water diet protocol and tolerated 420 ml in the first shift. Once meeting the water amount she was advanced to bariatric protein shakes which they tolerated and were discharged home POD 1.  Treatments: surgery: laparoscopic sleeve gastrectomy  Discharge Instructions  Discharge Instructions     Ambulate hourly while awake   Complete by: As directed    Call MD for:  difficulty breathing, headache or visual disturbances   Complete by: As directed    Call MD for:  persistant dizziness or light-headedness   Complete  by: As directed    Call MD for:  persistant nausea and vomiting   Complete by: As directed    Call MD for:  redness, tenderness, or signs of infection (pain, swelling, redness, odor or green/yellow discharge around incision site)   Complete by: As directed    Call MD for:  severe uncontrolled pain   Complete by: As directed    Call MD for:  temperature >101 F   Complete by: As directed    Diet bariatric full liquid   Complete by: As directed    Discharge wound care:   Complete by: As directed    Remove Bandaids tomorrow, ok to shower tomorrow. Steristrips may fall off in 1-3 weeks.   Incentive spirometry   Complete by: As directed    Perform hourly while awake      Allergies as of 09/13/2021       Reactions   Percocet [oxycodone-acetaminophen]    vertigo        Medication List     TAKE these medications    Accu-Chek Guide test strip Generic drug: glucose blood USE 1 TO CHECK BLOOD GLUCOSE TWICE DAILY   acetaminophen 500 MG tablet Commonly known as: TYLENOL Take 1,000 mg by mouth every 6 (six) hours as needed for moderate pain. Max 6 tabs per 24 hours   atorvastatin 10 MG tablet Commonly known as: LIPITOR Take 10 mg by mouth at bedtime.   bismuth subsalicylate 262 MG/15ML suspension Commonly known as: PEPTO BISMOL Take 30 mLs by mouth every 6 (six) hours as needed for diarrhea or loose  stools or indigestion.   celecoxib 100 MG capsule Commonly known as: CELEBREX Take 100 mg by mouth 2 (two) times daily.   cholecalciferol 25 MCG (1000 UNIT) tablet Commonly known as: VITAMIN D3 Take 1,000 Units by mouth daily.   estradiol 0.1 MG/GM vaginal cream Commonly known as: ESTRACE 1 Applicatorful 2 (two) times a week.   EUFLEXXA IX Inject 1 Dose into the articular space See admin instructions. Pt receives 1 dose every 6 to 12 months   ferrous sulfate 325 (65 FE) MG tablet Take 325 mg by mouth daily.   Fish Oil 1200 MG Caps Take 1,200 mg by mouth daily.    fluticasone 50 MCG/ACT nasal spray Commonly known as: FLONASE Place 2 sprays into both nostrils daily as needed for allergies or rhinitis.   HAIR SKIN & NAILS GUMMIES PO Take 2 capsules by mouth daily.   HYDROcodone-acetaminophen 5-325 MG tablet Commonly known as: NORCO/VICODIN Take 1 tablet by mouth every 4 (four) hours as needed for moderate pain. What changed: how much to take   ICY HOT MAX LIDOCAINE EX Apply 1 application topically daily as needed (pain).   Lidoderm 5 % Generic drug: lidocaine 1 patch daily as needed (pain).   losartan-hydrochlorothiazide 50-12.5 MG tablet Commonly known as: HYZAAR Take 1 tablet by mouth daily. Notes to patient: Monitor Blood Pressure Daily and keep a log for primary care physician.  You may need to make changes to your medications with rapid weight loss.     Lysine HCl 500 MG Tabs Take 500 mg by mouth daily.   Melatonin 1 MG Caps Take 0.5 mg by mouth at bedtime.   metFORMIN 500 MG tablet Commonly known as: GLUCOPHAGE Take 500 mg by mouth daily. Notes to patient: Monitor Blood Sugar Frequently and keep a log for primary care physician, you may need to adjust medication dosage with rapid weight loss.     Mounjaro 7.5 MG/0.5ML Pen Generic drug: tirzepatide Inject 7.5 mg into the skin every Monday. Notes to patient: Monitor Blood Sugar Frequently and keep a log for primary care physician, you may need to adjust medication dosage with rapid weight loss.     multivitamin with minerals Tabs tablet Take 1 tablet by mouth daily.   omeprazole 40 MG capsule Commonly known as: PRILOSEC Take 40 mg by mouth daily.   ondansetron 4 MG disintegrating tablet Commonly known as: ZOFRAN-ODT Take 1 tablet (4 mg total) by mouth every 6 (six) hours as needed for nausea or vomiting.   Osteo Bi-Flex Adv Joint Shield Tabs Take 1 tablet by mouth daily.   pregabalin 50 MG capsule Commonly known as: LYRICA Take 50 mg by mouth 2 (two) times daily.    PROBIOTIC PO Take 1 capsule by mouth daily.   tiZANidine 4 MG tablet Commonly known as: ZANAFLEX Take 2-4 mg by mouth daily as needed for muscle spasms.               Discharge Care Instructions  (From admission, onward)           Start     Ordered   09/13/21 0000  Discharge wound care:       Comments: Remove Bandaids tomorrow, ok to shower tomorrow. Steristrips may fall off in 1-3 weeks.   09/13/21 1148            Follow-up Information     Jouri Threat, De Blanch, MD. Go on 10/04/2021.   Specialty: General Surgery Why: at 10am.  Please arrive 15 minutes  prior to your appointment time.  Thank you. Contact information: [address] [place] [address] [phone]         Surgery, [employer]. Go on 11/03/2021.   Specialty: General Surgery Why: at 1:40pm with Dr. Sheliah Hatch.  Please arrive 15 minutes prior to your appointment time.  Thank you. Contact information: [address] [place] [address] [phone]                  The results of significant diagnostics from this hospitalization (including imaging, microbiology, ancillary and laboratory) are listed below for reference.    Significant Diagnostic Studies: No results found.  Labs: Basic Metabolic Panel: Recent Labs  Lab 09/13/21 0435  NA 138  K 3.3*  CL 103  CO2 27  GLUCOSE 156*  BUN 8  CREATININE 0.49  CALCIUM 8.9   Liver Function Tests: Recent Labs  Lab 09/13/21 0435  AST 17  ALT 19  ALKPHOS 42  BILITOT 0.5  PROT 6.3*  ALBUMIN 3.6    CBC: Recent Labs  Lab 09/12/21 0926 09/13/21 0435  WBC  --  8.0  NEUTROABS  --  5.7  HGB 13.9 11.8*  HCT 43.0 35.3*  MCV  --  92.9  PLT  --  241    CBG: Recent Labs  Lab 09/12/21 0857  GLUCAP 128*    Principal Problem:   Morbid (severe) obesity due to excess calories ([facility])   VTE plan: no chemical prophylaxis  recommended ([email or web address])  Time coordinating discharge: 15 min

## 2021-09-13 NOTE — Discharge Instructions (Signed)

## 2021-09-18 ENCOUNTER — Telehealth (HOSPITAL_COMMUNITY): Payer: Self-pay | Admitting: *Deleted

## 2021-09-19 NOTE — Telephone Encounter (Signed)
1.  Tell me about your pain and pain management? Pt denies any pain.  2.  Let's talk about fluid intake.  How much total fluid are you taking in? Pt states that she is getting in at least 64oz of fluid including protein shakes, bottled water, broth, and yogurt.  3.  How much protein have you taken in the last 2 days? Pt states she is meeting her goal of 60g of protein each day with the protein shakes, added protein powder, and yogurt.  4.  Have you had nausea?  Tell me about when have experienced nausea and what you did to help? Pt denies current nausea. Pt states that she has only had to take one nausea pill since discharge.   5.  Has the frequency or color changed with your urine? Pt states that she is urinating "plenty" with no changes in frequency or urgency.     6.  Tell me what your incisions look like? "Incisions look fine". Pt denies a fever, chills.  Pt states incisions are not swollen, open, or draining.  Pt encouraged to call [facility] if incisions change.   7.  Have you been passing gas? BM? Pt states that she is having BMs. Last BM 09/18/21.     8.  If a problem or question were to arise who would you call?  Do you know contact numbers for [facility], [facility], and [facility]? Pt denies dehydration symptoms.  Pt can describe s/sx of dehydration.  Pt knows to call [facility] for surgical, [facility] for nutrition, and [facility] for non-urgent questions or concerns.   9.  How has the walking going? Pt states she is getting around as best a she can with her knee scooter and able to be active without much difficulty.   10. Are you still using your incentive spirometer?  If so, how often? Pt states that she is doing the I.S. "couple times a day". Pt encouraged to use incentive spirometer, at least 10x every hour while awake until she sees the surgeon.  11.  How are your vitamins and calcium going?  How are you taking them? Pt states that she is taking her supplements and vitamins without difficulty.  Reminded patient  that the first 30 days post-operatively are important for successful recovery.  Practice good hand hygiene, wearing a mask when appropriate (since optional in most places), and minimizing exposure to people who live outside of the home, especially if they are exhibiting any respiratory, GI, or illness-like symptoms.

## 2021-09-26 ENCOUNTER — Encounter: Payer: BC Managed Care – PPO | Attending: General Surgery | Admitting: Skilled Nursing Facility1

## 2021-09-26 ENCOUNTER — Other Ambulatory Visit: Payer: Self-pay

## 2021-09-27 NOTE — Progress Notes (Signed)
2 Week Post-Operative Nutrition Class   Patient was seen on 09/27/2021 for Post-Operative Nutrition education at the [facility].    Surgery date: 09/12/2021 Surgery type: sleeve Start weight at [facility]: 264 Weight today: pt declined due to wearing a boot Bowel Habits: Every day to every other day no complaints   A1C 6.2 blood suagrs between 90-112  The following the learning objectives were met by the patient during this course: Identifies Phase 3 (Soft, High Proteins) Dietary Goals and will begin from 2 weeks post-operatively to 2 months post-operatively Identifies appropriate sources of fluids and proteins  Identifies appropriate fat sources and healthy verses unhealthy fat types   States protein recommendations and appropriate sources post-operatively Identifies the need for appropriate texture modifications, mastication, and bite sizes when consuming solids Identifies appropriate fat consumption and sources Identifies appropriate multivitamin and calcium sources post-operatively Describes the need for physical activity post-operatively and will follow MD recommendations States when to call healthcare provider regarding medication questions or post-operative complications   Handouts given during class include: Phase 3A: Soft, High Protein Diet Handout Phase 3 High Protein Meals Healthy Fats   Follow-Up Plan: Patient will follow-up at [facility] in 6 weeks for 2 month post-op nutrition visit for diet advancement per MD.

## 2021-09-28 DIAGNOSIS — M21612 Bunion of left foot: Secondary | ICD-10-CM | POA: Diagnosis not present

## 2021-09-28 DIAGNOSIS — M2042 Other hammer toe(s) (acquired), left foot: Secondary | ICD-10-CM | POA: Diagnosis not present

## 2021-10-02 ENCOUNTER — Telehealth: Payer: Self-pay | Admitting: Skilled Nursing Facility1

## 2021-10-02 NOTE — Telephone Encounter (Signed)
RD called pt to verify fluid intake once starting soft, solid proteins 2 week post-bariatric surgery.   Daily Fluid intake: 64 oz Daily Protein intake: 60 g Bowel Habits: every day to every day  Concerns/issues:   None stated

## 2021-10-04 DIAGNOSIS — R200 Anesthesia of skin: Secondary | ICD-10-CM | POA: Diagnosis not present

## 2021-10-04 DIAGNOSIS — R202 Paresthesia of skin: Secondary | ICD-10-CM | POA: Diagnosis not present

## 2021-10-04 DIAGNOSIS — E119 Type 2 diabetes mellitus without complications: Secondary | ICD-10-CM | POA: Diagnosis not present

## 2021-10-04 DIAGNOSIS — E569 Vitamin deficiency, unspecified: Secondary | ICD-10-CM | POA: Diagnosis not present

## 2021-10-04 DIAGNOSIS — I10 Essential (primary) hypertension: Secondary | ICD-10-CM | POA: Diagnosis not present

## 2021-10-19 DIAGNOSIS — M21612 Bunion of left foot: Secondary | ICD-10-CM | POA: Diagnosis not present

## 2021-10-19 DIAGNOSIS — M2042 Other hammer toe(s) (acquired), left foot: Secondary | ICD-10-CM | POA: Diagnosis not present

## 2021-10-20 ENCOUNTER — Other Ambulatory Visit (HOSPITAL_COMMUNITY): Payer: Self-pay

## 2021-11-07 ENCOUNTER — Encounter: Payer: BC Managed Care – PPO | Attending: General Surgery | Admitting: Skilled Nursing Facility1

## 2021-11-07 ENCOUNTER — Other Ambulatory Visit: Payer: Self-pay

## 2021-11-07 DIAGNOSIS — H524 Presbyopia: Secondary | ICD-10-CM | POA: Diagnosis not present

## 2021-11-07 DIAGNOSIS — E119 Type 2 diabetes mellitus without complications: Secondary | ICD-10-CM | POA: Diagnosis not present

## 2021-11-07 NOTE — Progress Notes (Signed)
Bariatric Nutrition Follow-Up Visit Medical Nutrition Therapy    NUTRITION ASSESSMENT    Anthropometrics  Surgery date: 09/12/2021 Surgery type: sleeve Start weight at [facility]: 264 Weight today: 232.3 pounds  Body Composition Scale 11/07/2021  Weight  lbs 232.3  Total Body Fat  % 43.6     Visceral Fat 15  Fat-Free Mass  % 56.3     Total Body Water  % 42.6     Muscle-Mass  lbs 31.4  BMI 38.3  Body Fat Displacement ---        Torso  lbs 62.7        Left Leg  lbs 12.5        Right Leg  lbs 12.5        Left Arm  lbs 6.2        Right Arm  lbs 6.2   Clinical  Medical hx: DM, HTN, hypercholesterolemia  Medications: stopped her diabetes medications  Labs: A1C 6.5, iron saturation 14, b12 1935 (took multivitmain before lab was taken)  Notable signs/symptoms: knee pain Any previous deficiencies? No   Lifestyle & Dietary Hx  Pt states her blood sugars have been about 112.   Pt states she was having tingling in her fingers so she got her labs drawn.  Pt states for some vegegatables she will use some ham or butter for flavor.  Pt states she wants to add oatmeal.  Aim for a total of 64 ounces if you have to start with gatorade will try green tea becase it drys her mouth a little  Estimated daily fluid intake: 42 oz Estimated daily protein intake: 80 g Supplements: multi and calcium  Current average weekly physical activity: stationary bike and working with trainer: 2 days a week working on increasing that   24-Hr Dietary Recall First Meal: protein shake or egg + [place] bacon or canadian bacon Snack:   Second Meal: pork chop or hamburger steak Snack:  yogurt Third Meal: chicken or shrimp Snack: sugar free popcicle Beverages: gatorade zero, water  Post-Op Goals/ Signs/ Symptoms Using straws: no Drinking while eating: no Chewing/swallowing difficulties: no Changes in vision: no Changes to mood/headaches: no Hair loss/changes to skin/nails: no Difficulty  focusing/concentrating: no Sweating: no Limb weakness: no Dizziness/lightheadedness: no Palpitations: no  Carbonated/caffeinated beverages: no N/V/D/C/Gas: yes 2-3 movements a week: taking mirilax  Abdominal pain: no Dumping syndrome: no    NUTRITION DIAGNOSIS  Overweight/obesity ([place]-3.3) related to past poor dietary habits and physical inactivity as evidenced by completed bariatric surgery and following dietary guidelines for continued weight loss and healthy nutrition status.     NUTRITION INTERVENTION Nutrition counseling (C-1) and education (E-2) to facilitate bariatric surgery goals, including: Diet advancement to the next phase (phase 4) now including non starchy vegetables The importance of consuming adequate calories as well as certain nutrients daily due to the body's need for essential vitamins, minerals, and fats The importance of daily physical activity and to reach a goal of at least 150 minutes of moderate to vigorous physical activity weekly (or as directed by their physician) due to benefits such as increased musculature and improved lab values The importance of intuitive eating specifically learning hunger-satiety cues and understanding the importance of learning a new body: The importance of mindful eating to avoid grazing behaviors   Goals: -Continue to aim for a minimum of 64 fluid ounces 7 days a week with at least 30 ounces being plain water  -Eat non-starchy vegetables 2 times a day 7 days a  week  -Start out with soft cooked vegetables today and tomorrow; if tolerated begin to eat raw vegetables or cooked including salads  -Eat your 3 ounces of protein first then start in on your non-starchy vegetables; once you understand how much of your meal leads to satisfaction and not full while still eating 3 ounces of protein and non-starchy vegetables you can eat them in any order   -Continue to aim for 30 minutes of activity at least 5 times a week  -Do NOT cook  with/add to your food: alfredo sauce, cheese sauce, barbeque sauce, ketchup, fat back, butter, bacon grease, grease, Crisco, OR SUGAR  -try some of the snack options given   Handouts Provided Include  Phase 4  Learning Style & Readiness for Change Teaching method utilized: Visual & Auditory  Demonstrated degree of understanding via: Teach Back  Readiness Level: action Barriers to learning/adherence to lifestyle change: none identified   RD's Notes for Next Visit Assess adherence to pt chosen goals     MONITORING & EVALUATION Dietary intake, weekly physical activity, body weight  Next Steps Patient is to follow-up in 3-4 months

## 2021-11-08 DIAGNOSIS — M25562 Pain in left knee: Secondary | ICD-10-CM | POA: Diagnosis not present

## 2021-11-08 DIAGNOSIS — R2689 Other abnormalities of gait and mobility: Secondary | ICD-10-CM | POA: Diagnosis not present

## 2021-11-08 DIAGNOSIS — M5416 Radiculopathy, lumbar region: Secondary | ICD-10-CM | POA: Diagnosis not present

## 2021-11-08 DIAGNOSIS — M6281 Muscle weakness (generalized): Secondary | ICD-10-CM | POA: Diagnosis not present

## 2021-11-13 DIAGNOSIS — E782 Mixed hyperlipidemia: Secondary | ICD-10-CM | POA: Diagnosis not present

## 2021-11-13 DIAGNOSIS — M25562 Pain in left knee: Secondary | ICD-10-CM | POA: Diagnosis not present

## 2021-11-13 DIAGNOSIS — E785 Hyperlipidemia, unspecified: Secondary | ICD-10-CM | POA: Diagnosis not present

## 2021-11-13 DIAGNOSIS — M5416 Radiculopathy, lumbar region: Secondary | ICD-10-CM | POA: Diagnosis not present

## 2021-11-13 DIAGNOSIS — Z0000 Encounter for general adult medical examination without abnormal findings: Secondary | ICD-10-CM | POA: Diagnosis not present

## 2021-11-13 DIAGNOSIS — E1169 Type 2 diabetes mellitus with other specified complication: Secondary | ICD-10-CM | POA: Diagnosis not present

## 2021-11-13 DIAGNOSIS — R2689 Other abnormalities of gait and mobility: Secondary | ICD-10-CM | POA: Diagnosis not present

## 2021-11-13 DIAGNOSIS — Z79899 Other long term (current) drug therapy: Secondary | ICD-10-CM | POA: Diagnosis not present

## 2021-11-13 DIAGNOSIS — M6281 Muscle weakness (generalized): Secondary | ICD-10-CM | POA: Diagnosis not present

## 2021-11-13 DIAGNOSIS — I10 Essential (primary) hypertension: Secondary | ICD-10-CM | POA: Diagnosis not present

## 2021-11-15 DIAGNOSIS — M6281 Muscle weakness (generalized): Secondary | ICD-10-CM | POA: Diagnosis not present

## 2021-11-15 DIAGNOSIS — M5416 Radiculopathy, lumbar region: Secondary | ICD-10-CM | POA: Diagnosis not present

## 2021-11-15 DIAGNOSIS — R2689 Other abnormalities of gait and mobility: Secondary | ICD-10-CM | POA: Diagnosis not present

## 2021-11-15 DIAGNOSIS — M25562 Pain in left knee: Secondary | ICD-10-CM | POA: Diagnosis not present

## 2021-11-20 DIAGNOSIS — M21612 Bunion of left foot: Secondary | ICD-10-CM | POA: Diagnosis not present

## 2021-11-20 DIAGNOSIS — M2042 Other hammer toe(s) (acquired), left foot: Secondary | ICD-10-CM | POA: Diagnosis not present

## 2021-11-21 DIAGNOSIS — M47816 Spondylosis without myelopathy or radiculopathy, lumbar region: Secondary | ICD-10-CM | POA: Diagnosis not present

## 2021-11-23 DIAGNOSIS — M6281 Muscle weakness (generalized): Secondary | ICD-10-CM | POA: Diagnosis not present

## 2021-11-23 DIAGNOSIS — M5416 Radiculopathy, lumbar region: Secondary | ICD-10-CM | POA: Diagnosis not present

## 2021-11-23 DIAGNOSIS — M25562 Pain in left knee: Secondary | ICD-10-CM | POA: Diagnosis not present

## 2021-11-23 DIAGNOSIS — R2689 Other abnormalities of gait and mobility: Secondary | ICD-10-CM | POA: Diagnosis not present

## 2021-11-28 DIAGNOSIS — M25562 Pain in left knee: Secondary | ICD-10-CM | POA: Diagnosis not present

## 2021-11-30 DIAGNOSIS — Z23 Encounter for immunization: Secondary | ICD-10-CM | POA: Diagnosis not present

## 2021-11-30 DIAGNOSIS — R2689 Other abnormalities of gait and mobility: Secondary | ICD-10-CM | POA: Diagnosis not present

## 2021-11-30 DIAGNOSIS — L578 Other skin changes due to chronic exposure to nonionizing radiation: Secondary | ICD-10-CM | POA: Diagnosis not present

## 2021-11-30 DIAGNOSIS — M6281 Muscle weakness (generalized): Secondary | ICD-10-CM | POA: Diagnosis not present

## 2021-11-30 DIAGNOSIS — L814 Other melanin hyperpigmentation: Secondary | ICD-10-CM | POA: Diagnosis not present

## 2021-11-30 DIAGNOSIS — D225 Melanocytic nevi of trunk: Secondary | ICD-10-CM | POA: Diagnosis not present

## 2021-11-30 DIAGNOSIS — L570 Actinic keratosis: Secondary | ICD-10-CM | POA: Diagnosis not present

## 2021-11-30 DIAGNOSIS — M5416 Radiculopathy, lumbar region: Secondary | ICD-10-CM | POA: Diagnosis not present

## 2021-11-30 DIAGNOSIS — M25562 Pain in left knee: Secondary | ICD-10-CM | POA: Diagnosis not present

## 2021-11-30 DIAGNOSIS — L821 Other seborrheic keratosis: Secondary | ICD-10-CM | POA: Diagnosis not present

## 2021-12-01 DIAGNOSIS — M7072 Other bursitis of hip, left hip: Secondary | ICD-10-CM | POA: Diagnosis not present

## 2021-12-07 DIAGNOSIS — M6281 Muscle weakness (generalized): Secondary | ICD-10-CM | POA: Diagnosis not present

## 2021-12-07 DIAGNOSIS — R2689 Other abnormalities of gait and mobility: Secondary | ICD-10-CM | POA: Diagnosis not present

## 2021-12-07 DIAGNOSIS — M5416 Radiculopathy, lumbar region: Secondary | ICD-10-CM | POA: Diagnosis not present

## 2021-12-07 DIAGNOSIS — M25562 Pain in left knee: Secondary | ICD-10-CM | POA: Diagnosis not present

## 2021-12-12 DIAGNOSIS — M25562 Pain in left knee: Secondary | ICD-10-CM | POA: Diagnosis not present

## 2021-12-14 DIAGNOSIS — M6281 Muscle weakness (generalized): Secondary | ICD-10-CM | POA: Diagnosis not present

## 2021-12-14 DIAGNOSIS — M5416 Radiculopathy, lumbar region: Secondary | ICD-10-CM | POA: Diagnosis not present

## 2021-12-14 DIAGNOSIS — M25562 Pain in left knee: Secondary | ICD-10-CM | POA: Diagnosis not present

## 2021-12-14 DIAGNOSIS — R2689 Other abnormalities of gait and mobility: Secondary | ICD-10-CM | POA: Diagnosis not present

## 2021-12-21 DIAGNOSIS — R2689 Other abnormalities of gait and mobility: Secondary | ICD-10-CM | POA: Diagnosis not present

## 2021-12-21 DIAGNOSIS — M5416 Radiculopathy, lumbar region: Secondary | ICD-10-CM | POA: Diagnosis not present

## 2021-12-21 DIAGNOSIS — M25562 Pain in left knee: Secondary | ICD-10-CM | POA: Diagnosis not present

## 2021-12-21 DIAGNOSIS — M6281 Muscle weakness (generalized): Secondary | ICD-10-CM | POA: Diagnosis not present

## 2021-12-28 DIAGNOSIS — M6281 Muscle weakness (generalized): Secondary | ICD-10-CM | POA: Diagnosis not present

## 2021-12-28 DIAGNOSIS — M25562 Pain in left knee: Secondary | ICD-10-CM | POA: Diagnosis not present

## 2021-12-28 DIAGNOSIS — M5416 Radiculopathy, lumbar region: Secondary | ICD-10-CM | POA: Diagnosis not present

## 2021-12-28 DIAGNOSIS — R2689 Other abnormalities of gait and mobility: Secondary | ICD-10-CM | POA: Diagnosis not present

## 2022-01-01 DIAGNOSIS — M21612 Bunion of left foot: Secondary | ICD-10-CM | POA: Diagnosis not present

## 2022-01-01 DIAGNOSIS — M2042 Other hammer toe(s) (acquired), left foot: Secondary | ICD-10-CM | POA: Diagnosis not present

## 2022-01-11 DIAGNOSIS — R2689 Other abnormalities of gait and mobility: Secondary | ICD-10-CM | POA: Diagnosis not present

## 2022-01-11 DIAGNOSIS — M6281 Muscle weakness (generalized): Secondary | ICD-10-CM | POA: Diagnosis not present

## 2022-01-11 DIAGNOSIS — M5416 Radiculopathy, lumbar region: Secondary | ICD-10-CM | POA: Diagnosis not present

## 2022-01-11 DIAGNOSIS — M25562 Pain in left knee: Secondary | ICD-10-CM | POA: Diagnosis not present

## 2022-01-18 DIAGNOSIS — M25562 Pain in left knee: Secondary | ICD-10-CM | POA: Diagnosis not present

## 2022-01-18 DIAGNOSIS — M6281 Muscle weakness (generalized): Secondary | ICD-10-CM | POA: Diagnosis not present

## 2022-01-18 DIAGNOSIS — R2689 Other abnormalities of gait and mobility: Secondary | ICD-10-CM | POA: Diagnosis not present

## 2022-01-18 DIAGNOSIS — M5416 Radiculopathy, lumbar region: Secondary | ICD-10-CM | POA: Diagnosis not present

## 2022-01-25 DIAGNOSIS — M6281 Muscle weakness (generalized): Secondary | ICD-10-CM | POA: Diagnosis not present

## 2022-01-25 DIAGNOSIS — M5416 Radiculopathy, lumbar region: Secondary | ICD-10-CM | POA: Diagnosis not present

## 2022-01-25 DIAGNOSIS — R2689 Other abnormalities of gait and mobility: Secondary | ICD-10-CM | POA: Diagnosis not present

## 2022-01-25 DIAGNOSIS — M25562 Pain in left knee: Secondary | ICD-10-CM | POA: Diagnosis not present

## 2022-01-29 IMAGING — DX DG CHEST 2V
2 series · 2 of 2 positions shown · non-contrast
Comparison: None.

CLINICAL DATA: Morbid obesity. Pre-op evaluation for bariatric
surgery. Diabetes. Gastroesophageal reflux disease.

EXAM:
CHEST - 2 VIEW

[chest lat]
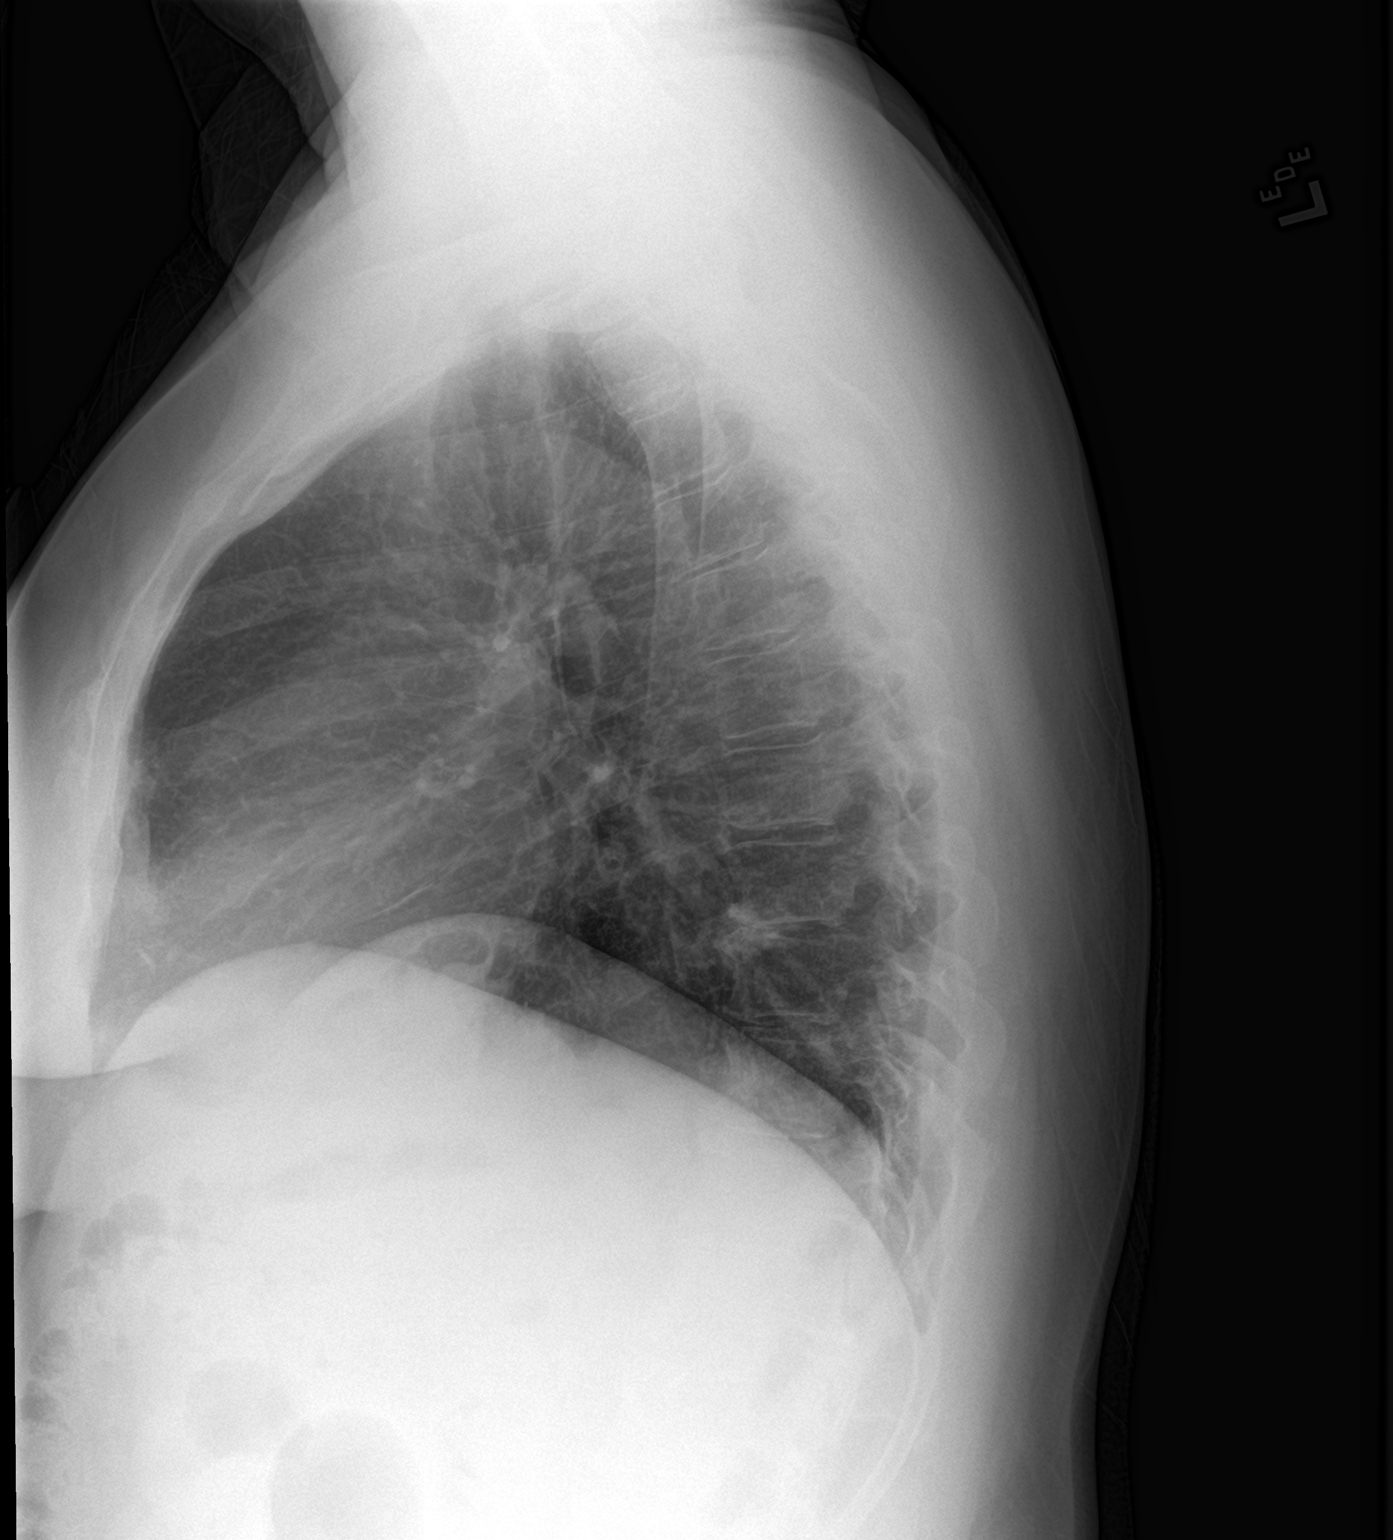

[chest pa]
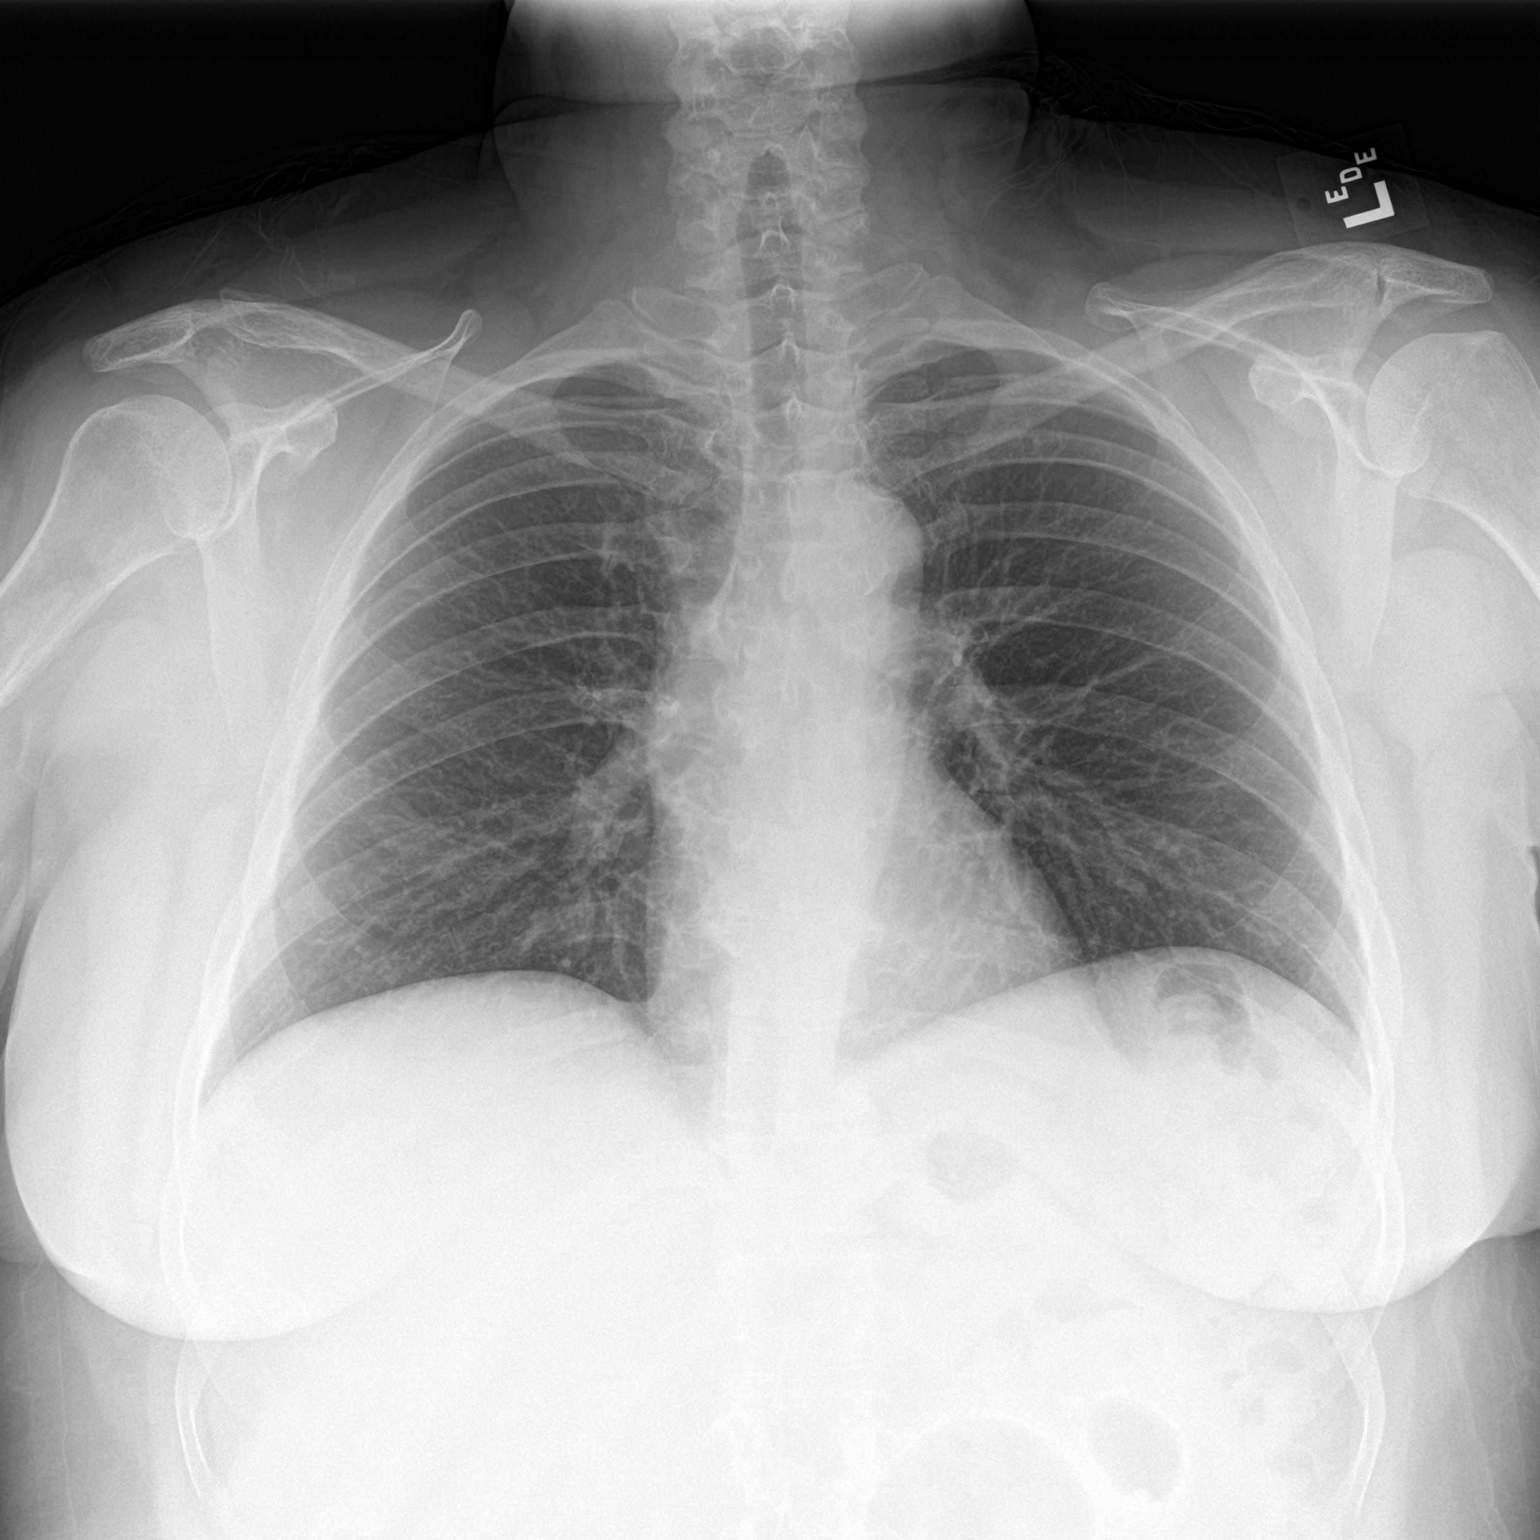

[2 of 2 positions shown; findings below may reference images not displayed]

FINDINGS: The heart size and mediastinal contours are within normal limits.
Both lungs are clear. Mild lower thoracic spine degenerative changes
noted.
IMPRESSION: No active cardiopulmonary disease.

## 2022-01-30 DIAGNOSIS — R2689 Other abnormalities of gait and mobility: Secondary | ICD-10-CM | POA: Diagnosis not present

## 2022-01-30 DIAGNOSIS — M25562 Pain in left knee: Secondary | ICD-10-CM | POA: Diagnosis not present

## 2022-01-30 DIAGNOSIS — M6281 Muscle weakness (generalized): Secondary | ICD-10-CM | POA: Diagnosis not present

## 2022-01-30 DIAGNOSIS — M5416 Radiculopathy, lumbar region: Secondary | ICD-10-CM | POA: Diagnosis not present

## 2022-01-30 DIAGNOSIS — M4726 Other spondylosis with radiculopathy, lumbar region: Secondary | ICD-10-CM | POA: Diagnosis not present

## 2022-02-05 DIAGNOSIS — R2689 Other abnormalities of gait and mobility: Secondary | ICD-10-CM | POA: Diagnosis not present

## 2022-02-05 DIAGNOSIS — M6281 Muscle weakness (generalized): Secondary | ICD-10-CM | POA: Diagnosis not present

## 2022-02-05 DIAGNOSIS — M25562 Pain in left knee: Secondary | ICD-10-CM | POA: Diagnosis not present

## 2022-02-05 DIAGNOSIS — M5416 Radiculopathy, lumbar region: Secondary | ICD-10-CM | POA: Diagnosis not present

## 2022-02-06 ENCOUNTER — Encounter: Payer: BC Managed Care – PPO | Attending: General Surgery | Admitting: Skilled Nursing Facility1

## 2022-02-06 DIAGNOSIS — J309 Allergic rhinitis, unspecified: Secondary | ICD-10-CM | POA: Diagnosis not present

## 2022-02-07 ENCOUNTER — Encounter: Payer: Self-pay | Admitting: Skilled Nursing Facility1

## 2022-02-07 NOTE — Progress Notes (Signed)
Follow-up visit:  Post-Operative Sleeve Surgery ? ?Medical Nutrition Therapy:  Appt start time: 6:00pm end time:  7:00pm ? ?Primary concerns today: Post-operative Bariatric Surgery Nutrition Management 6 Month Post-Op Class ? ?Anthropometrics  ?Surgery date: 09/12/2021 ?Surgery type: sleeve ?Start weight at [facility]: 264 ?Weight today: 183.1 pounds ? ?Body Composition Scale 11/07/2021 02/07/2022  ?Weight  lbs 232.3 183.1  ?Total Body Fat  % 43.6 36.9  ?   Visceral Fat 15 10  ?Fat-Free Mass  % 56.3 63  ?   Total Body Water  % 42.6 46  ?   Muscle-Mass  lbs 31.4 30.8  ?BMI 38.3 30.1  ?Body Fat Displacement ---   ?      Torso  lbs 62.7 41.7  ?      Left Leg  lbs 12.5 8.3  ?      Right Leg  lbs 12.5 8.3  ?      Left Arm  lbs 6.2 4.1  ?      Right Arm  lbs 6.2 4.1  ? ?Clinical  ?Medical hx: DM, HTN, hypercholesterolemia  ?Medications: stopped her diabetes medications  ?Labs: A1C 6.5, iron saturation 14, b12 1935 (took multivitmain before lab was taken)  ?Notable signs/symptoms: knee pain ?Any previous deficiencies? No ? ? ? ?Information Reviewed/ Discussed During Appointment: ?-Review of composition scale numbers ?-Fluid requirements (64-100 ounces) ?-Protein requirements (60-80g) ?-Strategies for tolerating diet ?-Advancement of diet to include Starchy vegetables ?-Barriers to inclusion of new foods ?-Inclusion of appropriate multivitamin and calcium supplements  ?-Exercise recommendations  ? ?Fluid intake: adequate  ? ?Medications: See List ?Supplementation: appropriate  ? ?Using straws: no ?Drinking while eating: no ?Having you been chewing well: yes ?Chewing/swallowing difficulties: no ?Changes in vision: no ?Changes to mood/headaches: no ?Hair loss/C[place]nges to skin/Changes to nails: no ?Any difficulty focusing or concentrating: no ?Sweating: no ?Dizziness/Lightheaded: no ?Palpitations: no  ?Carbonated beverages: no ?N/V/D/C/GAS: no ?Abdominal Pain: no ?Dumping syndrome: no ? ?Recent physical activity:  ADL's ? ?Progress  Towards Goal(s):  In Progress ?Teaching method utilized: Visual & Auditory  ?Demonstrated degree of understanding via: Teach Back  ?Readiness Level: Action ?Barriers to learning/adherence to lifestyle change: none identified ? ?Handouts given during visit include: ?Phase V diet Progression  ?Goals Sheet ?The Benefits of Exercise are endless..... ?Support Group Topics ? ? ?Teaching Method Utilized:  ?Visual ?Auditory ?Hands on ? ?Demonstrated degree of understanding via:  Teach Back  ? ?Monitoring/Evaluation:  Dietary intake, exercise, and body weight. Follow up in 3 months for 9 month post-op visit. ? ?

## 2022-02-12 DIAGNOSIS — M6281 Muscle weakness (generalized): Secondary | ICD-10-CM | POA: Diagnosis not present

## 2022-02-12 DIAGNOSIS — M62830 Muscle spasm of back: Secondary | ICD-10-CM | POA: Diagnosis not present

## 2022-02-12 DIAGNOSIS — M5416 Radiculopathy, lumbar region: Secondary | ICD-10-CM | POA: Diagnosis not present

## 2022-02-15 DIAGNOSIS — M545 Low back pain, unspecified: Secondary | ICD-10-CM | POA: Diagnosis not present

## 2022-02-20 DIAGNOSIS — M62830 Muscle spasm of back: Secondary | ICD-10-CM | POA: Diagnosis not present

## 2022-02-20 DIAGNOSIS — M5416 Radiculopathy, lumbar region: Secondary | ICD-10-CM | POA: Diagnosis not present

## 2022-02-20 DIAGNOSIS — M6281 Muscle weakness (generalized): Secondary | ICD-10-CM | POA: Diagnosis not present

## 2022-02-27 DIAGNOSIS — M533 Sacrococcygeal disorders, not elsewhere classified: Secondary | ICD-10-CM | POA: Diagnosis not present

## 2022-03-01 DIAGNOSIS — M62830 Muscle spasm of back: Secondary | ICD-10-CM | POA: Diagnosis not present

## 2022-03-01 DIAGNOSIS — M5416 Radiculopathy, lumbar region: Secondary | ICD-10-CM | POA: Diagnosis not present

## 2022-03-01 DIAGNOSIS — M6281 Muscle weakness (generalized): Secondary | ICD-10-CM | POA: Diagnosis not present

## 2022-03-07 DIAGNOSIS — M62830 Muscle spasm of back: Secondary | ICD-10-CM | POA: Diagnosis not present

## 2022-03-07 DIAGNOSIS — M5416 Radiculopathy, lumbar region: Secondary | ICD-10-CM | POA: Diagnosis not present

## 2022-03-07 DIAGNOSIS — M6281 Muscle weakness (generalized): Secondary | ICD-10-CM | POA: Diagnosis not present

## 2022-03-12 DIAGNOSIS — M5416 Radiculopathy, lumbar region: Secondary | ICD-10-CM | POA: Diagnosis not present

## 2022-03-13 DIAGNOSIS — M21612 Bunion of left foot: Secondary | ICD-10-CM | POA: Diagnosis not present

## 2022-03-14 DIAGNOSIS — M6281 Muscle weakness (generalized): Secondary | ICD-10-CM | POA: Diagnosis not present

## 2022-03-14 DIAGNOSIS — M5416 Radiculopathy, lumbar region: Secondary | ICD-10-CM | POA: Diagnosis not present

## 2022-03-14 DIAGNOSIS — M62830 Muscle spasm of back: Secondary | ICD-10-CM | POA: Diagnosis not present

## 2022-03-28 DIAGNOSIS — M62830 Muscle spasm of back: Secondary | ICD-10-CM | POA: Diagnosis not present

## 2022-03-28 DIAGNOSIS — M6281 Muscle weakness (generalized): Secondary | ICD-10-CM | POA: Diagnosis not present

## 2022-03-28 DIAGNOSIS — M5416 Radiculopathy, lumbar region: Secondary | ICD-10-CM | POA: Diagnosis not present

## 2022-04-09 DIAGNOSIS — M47816 Spondylosis without myelopathy or radiculopathy, lumbar region: Secondary | ICD-10-CM | POA: Diagnosis not present

## 2022-04-19 DIAGNOSIS — M47816 Spondylosis without myelopathy or radiculopathy, lumbar region: Secondary | ICD-10-CM | POA: Diagnosis not present

## 2022-05-07 DIAGNOSIS — M2042 Other hammer toe(s) (acquired), left foot: Secondary | ICD-10-CM | POA: Diagnosis not present

## 2022-05-09 ENCOUNTER — Encounter: Payer: BC Managed Care – PPO | Attending: General Surgery | Admitting: Skilled Nursing Facility1

## 2022-05-09 NOTE — Progress Notes (Signed)
Follow-up visit:  Post-Operative Sleeve Surgery  Primary concerns today: Post-operative Bariatric Surgery Nutrition Management   Anthropometrics  Surgery date: 09/12/2021 Surgery type: sleeve Start weight at [facility]: 264 Weight today: 189.4 pounds; arrived in a boot so unable to do bodycomp scale  Body Composition Scale 11/07/2021 02/07/2022  Weight  lbs 232.3 183.1  Total Body Fat  % 43.6 36.9     Visceral Fat 15 10  Fat-Free Mass  % 56.3 63     Total Body Water  % 42.6 46     Muscle-Mass  lbs 31.4 30.8  BMI 38.3 30.1  Body Fat Displacement ---         Torso  lbs 62.7 41.7        Left Leg  lbs 12.5 8.3        Right Leg  lbs 12.5 8.3        Left Arm  lbs 6.2 4.1        Right Arm  lbs 6.2 4.1   Clinical  Medical hx: DM, HTN, hypercholesterolemia  Medications: stopped her diabetes medications  Labs: A1C 6.5, iron saturation 14, b12 1935 (took multivitmain before lab was taken)  Notable signs/symptoms: knee pain Any previous deficiencies? No   Pt states she knows she was advised to not weigh every day but does.  Pt arrives with a boot due to recent toe surgery.  Pt state she has to constantly remind herself a weight loss slow is normal and trying to let the food fear go from previous dieting habits.  Pt states she notices since cutting carbs back out she cannot lift weights and feels weak so is motivated to add them back in to get her strength and power back.   24 hr recall:  First meal: oatmeal or high fiber cereal or protein shake (more often) Snack: Second meal: chicken or lean ground beef + squash or zuchini or green beans or tomatoes or cucumber  Snack: chic peas or pistachios or peas Third meal: chicken or lean ground beef + squash or zuchini or green beans or tomatoes + pepper or cucumber Snack:  Beverages: water  Fluid intake: 64 oz   Medications: See List Supplementation: appropriate   Using straws: no Drinking while eating: no Having you been chewing well:  yes Chewing/swallowing difficulties: no Changes in vision: no Changes to mood/headaches: no Hair loss/Cahnges to skin/Changes to nails: no Any difficulty focusing or concentrating: no Sweating: no Dizziness/Lightheaded: no Palpitations: no  Carbonated beverages: no N/V/D/C/GAS: no Abdominal Pain: no Dumping syndrome: no  Recent physical activity:  trainer 1 day a week, yoga one day a week, walking her road 4 days a week  Progress Towards Goal(s):  In Progress Teaching method utilized: Visual & Auditory  Demonstrated degree of understanding via: Teach Back  Readiness Level: Action Barriers to learning/adherence to lifestyle change: none identified  Education Topics: Encouraged patient to honor their body's internal hunger and fullness cues.  Throughout the day, check in mentally and rate hunger. Stop eating when satisfied not full regardless of how much food is left on the plate.  Get more if still hungry 20-30 minutes later.  The key is to honor satisfaction so throughout the meal, rate fullness factor and stop when comfortably satisfied not physically full. The key is to honor hunger and fullness without any feelings of guilt or shame.  Pay attention to what the internal cues are, rather than any external factors. This will enhance the confidence you have in  listening to your own body and following those internal cues enabling you to increase how often you eat when you are hungry not out of appetite and stop when you are satisfied not full.  Encouraged pt to continue to eat balanced meals inclusive of non starchy vegetables 2 times a day 7 days a week Encouraged pt to choose lean protein sources: limiting beef, pork, sausage, hotdogs, and lunch meat Encourage pt to choose healthy fats such as plant based limiting animal fats Encouraged pt to continue to drink a minium 64 fluid ounces with half being plain water to satisfy proper hydration    Goals: Work on Marketing executive Add complex  carbohydrates back in   Teaching Method Utilized:  Visual Auditory Hands on  Demonstrated degree of understanding via:  Teach Back   Monitoring/Evaluation:  Dietary intake, exercise, and body weight. December

## 2022-05-14 DIAGNOSIS — I10 Essential (primary) hypertension: Secondary | ICD-10-CM | POA: Diagnosis not present

## 2022-05-14 DIAGNOSIS — E785 Hyperlipidemia, unspecified: Secondary | ICD-10-CM | POA: Diagnosis not present

## 2022-05-14 DIAGNOSIS — E1169 Type 2 diabetes mellitus with other specified complication: Secondary | ICD-10-CM | POA: Diagnosis not present

## 2022-05-14 DIAGNOSIS — E782 Mixed hyperlipidemia: Secondary | ICD-10-CM | POA: Diagnosis not present

## 2022-06-14 DIAGNOSIS — M461 Sacroiliitis, not elsewhere classified: Secondary | ICD-10-CM | POA: Diagnosis not present

## 2022-06-14 DIAGNOSIS — M47816 Spondylosis without myelopathy or radiculopathy, lumbar region: Secondary | ICD-10-CM | POA: Diagnosis not present

## 2022-07-04 DIAGNOSIS — R928 Other abnormal and inconclusive findings on diagnostic imaging of breast: Secondary | ICD-10-CM | POA: Diagnosis not present

## 2022-07-04 DIAGNOSIS — N6324 Unspecified lump in the left breast, lower inner quadrant: Secondary | ICD-10-CM | POA: Diagnosis not present

## 2022-07-04 DIAGNOSIS — N6002 Solitary cyst of left breast: Secondary | ICD-10-CM | POA: Diagnosis not present

## 2022-07-10 ENCOUNTER — Telehealth: Payer: Self-pay

## 2022-07-10 NOTE — Patient Outreach (Signed)
  Care Coordination   Initial Visit Note   07/10/2022 Name: BRAYLA PAT MRN: [id number] DOB: 1966-03-28  CHRISIE JANKOVICH is a 56 y.o. year old female who sees Cyndi Bender, [place] for primary care. I spoke with  Concha Norway by phone today.  What matters to the patients health and wellness today?  Placed call to patient today and explained and offered [facility] care coordination program.   Patient reports she is doing well and declines.  SDOH assessments and interventions completed:  No     Care Coordination Interventions Activated:  No  Care Coordination Interventions:  No, not indicated   Follow up plan: No further intervention required.   Encounter Outcome:  Pt. Refused   Tomasa Rand, RN, BSN, CEN [facility] [employer] [phone]

## 2022-07-13 DIAGNOSIS — M461 Sacroiliitis, not elsewhere classified: Secondary | ICD-10-CM | POA: Diagnosis not present

## 2022-07-18 DIAGNOSIS — M76892 Other specified enthesopathies of left lower limb, excluding foot: Secondary | ICD-10-CM | POA: Diagnosis not present

## 2022-07-18 DIAGNOSIS — M1712 Unilateral primary osteoarthritis, left knee: Secondary | ICD-10-CM | POA: Diagnosis not present

## 2022-07-20 DIAGNOSIS — R61 Generalized hyperhidrosis: Secondary | ICD-10-CM | POA: Diagnosis not present

## 2022-08-13 DIAGNOSIS — M47816 Spondylosis without myelopathy or radiculopathy, lumbar region: Secondary | ICD-10-CM | POA: Diagnosis not present

## 2022-08-16 DIAGNOSIS — M1712 Unilateral primary osteoarthritis, left knee: Secondary | ICD-10-CM | POA: Diagnosis not present

## 2022-08-20 DIAGNOSIS — M47816 Spondylosis without myelopathy or radiculopathy, lumbar region: Secondary | ICD-10-CM | POA: Diagnosis not present

## 2022-08-21 DIAGNOSIS — M1712 Unilateral primary osteoarthritis, left knee: Secondary | ICD-10-CM | POA: Diagnosis not present

## 2022-08-27 DIAGNOSIS — R7989 Other specified abnormal findings of blood chemistry: Secondary | ICD-10-CM | POA: Diagnosis not present

## 2022-08-28 DIAGNOSIS — M1712 Unilateral primary osteoarthritis, left knee: Secondary | ICD-10-CM | POA: Diagnosis not present

## 2022-09-10 DIAGNOSIS — L03115 Cellulitis of right lower limb: Secondary | ICD-10-CM | POA: Diagnosis not present

## 2022-09-11 DIAGNOSIS — M47816 Spondylosis without myelopathy or radiculopathy, lumbar region: Secondary | ICD-10-CM | POA: Diagnosis not present

## 2022-09-17 ENCOUNTER — Encounter: Payer: Self-pay | Admitting: Dietician

## 2022-09-17 ENCOUNTER — Encounter: Payer: BC Managed Care – PPO | Attending: Physician Assistant | Admitting: Dietician

## 2022-09-17 VITALS — Ht 65.0 in | Wt 168.0 lb

## 2022-09-17 DIAGNOSIS — E669 Obesity, unspecified: Secondary | ICD-10-CM | POA: Diagnosis not present

## 2022-09-17 NOTE — Progress Notes (Signed)
Follow-up visit:  Post-Operative Sleeve Surgery  Primary concerns today: Post-operative Bariatric Surgery Nutrition Management   Anthropometrics  Surgery date: 09/12/2021 Surgery type: sleeve Start weight at [facility]: 264 Weight today: 168.1 lbs   Body Composition Scale 11/07/2021 02/07/2022  Weight  lbs 232.3 183.1  Total Body Fat  % 43.6 36.9     Visceral Fat 15 10  Fat-Free Mass  % 56.3 63     Total Body Water  % 42.6 46     Muscle-Mass  lbs 31.4 30.8  BMI 38.3 30.1  Body Fat Displacement ---         Torso  lbs 62.7 41.7        Left Leg  lbs 12.5 8.3        Right Leg  lbs 12.5 8.3        Left Arm  lbs 6.2 4.1        Right Arm  lbs 6.2 4.1   Clinical  Medical hx: DM, HTN, hypercholesterolemia  Medications: stopped her diabetes medications  Labs: A1C 6.5, iron saturation 14, b12 1935 (took multivitmain before lab was taken)  Notable signs/symptoms: knee pain Any previous deficiencies? No  Pt states she usually doesn't have room for carbohydrates after eating meat and vegetables.  Pt states she weighs 2-3 times a week. Pt states she is weight lifting and has been able to increase weights. Pt states she does weight lifting, cardio, and yoga 3-4 days a week.   24 hr recall: First meal: cereal and Fairlife milk low fat  Snack: None.  Second meal: meat and vegetables (green beans, carrots, cabbage, peas, squash) Snack: chick pea, nuts, and flavored peas or yogurt with blueberries and granola  Third meal: meat and vegetables and rice or potato  Snack: sometimes sugar free popsicle   Beverages: water, 1 Lipton diet tea (16 oz)  Fluid intake: 64 oz   Medications: See List Supplementation: appropriate   Using straws: no Drinking while eating: no Having you been chewing well: yes Chewing/swallowing difficulties: no Changes in vision: no Changes to mood/headaches: no Hair loss/Cahnges to skin/Changes to nails: no Any difficulty focusing or concentrating: no Sweating:  no Dizziness/Lightheaded: no Palpitations: no  Carbonated beverages: no N/V/D/C/GAS: no Abdominal Pain: no Dumping syndrome: no  Recent physical activity: yoga, weight lifting, and cardio 3-4 times a week.   Progress Towards Goal(s):  In Progress Teaching method utilized: Visual & Auditory  Demonstrated degree of understanding via: Teach Back  Readiness Level: Action Barriers to learning/adherence to lifestyle change: none identified  Education Topics: Encouraged patient to honor their body's internal hunger and fullness cues.  Throughout the day, check in mentally and rate hunger. Stop eating when satisfied not full regardless of how much food is left on the plate.  Get more if still hungry 20-30 minutes later.  The key is to honor satisfaction so throughout the meal, rate fullness factor and stop when comfortably satisfied not physically full. The key is to honor hunger and fullness without any feelings of guilt or shame.  Pay attention to what the internal cues are, rather than any external factors. This will enhance the confidence you have in listening to your own body and following those internal cues enabling you to increase how often you eat when you are hungry not out of appetite and stop when you are satisfied not full.  Encouraged pt to continue to eat balanced meals inclusive of non starchy vegetables 2 times a day 7 days a  week Encouraged pt to choose lean protein sources: limiting beef, pork, sausage, hotdogs, and lunch meat Encourage pt to choose healthy fats such as plant based limiting animal fats Encouraged pt to continue to drink a minium 64 fluid ounces with half being plain water to satisfy proper hydration  Why you need complex carbohydrates: Whole grains and other complex carbohydrates are required to have a healthy diet. Whole grains provide fiber which can help with blood glucose levels and help keep you satiated. Fruits and starchy vegetables provide essential vitamins  and minerals required for immune function, eyesight support, brain support, bone density, wound healing and many other functions within the body. According to the current evidenced based 2020-2025 Dietary Guidelines for Americans, complex carbohydrates are part of a healthy eating pattern which is associated with a decreased risk for type 2 diabetes, cancers, and cardiovascular disease.     Goals: - Adding a complex carbohydrate at lunch. - Continue keeping up with the water intake of 64 oz.  - After reaching goal weight, add a morning snack to maintain weight if needed.   Teaching Method Utilized:  Visual Auditory Hands on  Demonstrated degree of understanding via:  Teach Back   Monitoring/Evaluation:  Dietary intake, exercise, and body weight. Follow-up in 3 months.

## 2022-09-20 DIAGNOSIS — Z9884 Bariatric surgery status: Secondary | ICD-10-CM | POA: Diagnosis not present

## 2022-09-20 DIAGNOSIS — L659 Nonscarring hair loss, unspecified: Secondary | ICD-10-CM | POA: Diagnosis not present

## 2022-09-20 DIAGNOSIS — M47816 Spondylosis without myelopathy or radiculopathy, lumbar region: Secondary | ICD-10-CM | POA: Diagnosis not present

## 2022-10-08 DIAGNOSIS — E1169 Type 2 diabetes mellitus with other specified complication: Secondary | ICD-10-CM | POA: Diagnosis not present

## 2022-10-08 DIAGNOSIS — J309 Allergic rhinitis, unspecified: Secondary | ICD-10-CM | POA: Diagnosis not present

## 2022-10-08 DIAGNOSIS — Z6827 Body mass index (BMI) 27.0-27.9, adult: Secondary | ICD-10-CM | POA: Diagnosis not present

## 2022-10-08 DIAGNOSIS — E785 Hyperlipidemia, unspecified: Secondary | ICD-10-CM | POA: Diagnosis not present

## 2022-10-19 DIAGNOSIS — M47816 Spondylosis without myelopathy or radiculopathy, lumbar region: Secondary | ICD-10-CM | POA: Diagnosis not present

## 2022-10-29 DIAGNOSIS — M47816 Spondylosis without myelopathy or radiculopathy, lumbar region: Secondary | ICD-10-CM | POA: Diagnosis not present

## 2022-11-16 DIAGNOSIS — M545 Low back pain, unspecified: Secondary | ICD-10-CM | POA: Diagnosis not present

## 2022-11-16 DIAGNOSIS — R112 Nausea with vomiting, unspecified: Secondary | ICD-10-CM | POA: Diagnosis not present

## 2022-11-16 DIAGNOSIS — N201 Calculus of ureter: Secondary | ICD-10-CM | POA: Diagnosis not present

## 2022-11-16 DIAGNOSIS — Z885 Allergy status to narcotic agent status: Secondary | ICD-10-CM | POA: Diagnosis not present

## 2022-11-16 DIAGNOSIS — F1090 Alcohol use, unspecified, uncomplicated: Secondary | ICD-10-CM | POA: Diagnosis not present

## 2022-11-16 DIAGNOSIS — N202 Calculus of kidney with calculus of ureter: Secondary | ICD-10-CM | POA: Diagnosis not present

## 2022-11-16 DIAGNOSIS — R109 Unspecified abdominal pain: Secondary | ICD-10-CM | POA: Diagnosis not present

## 2022-11-19 DIAGNOSIS — E782 Mixed hyperlipidemia: Secondary | ICD-10-CM | POA: Diagnosis not present

## 2022-11-19 DIAGNOSIS — Z79899 Other long term (current) drug therapy: Secondary | ICD-10-CM | POA: Diagnosis not present

## 2022-11-19 DIAGNOSIS — I10 Essential (primary) hypertension: Secondary | ICD-10-CM | POA: Diagnosis not present

## 2022-11-19 DIAGNOSIS — E785 Hyperlipidemia, unspecified: Secondary | ICD-10-CM | POA: Diagnosis not present

## 2022-11-19 DIAGNOSIS — E1169 Type 2 diabetes mellitus with other specified complication: Secondary | ICD-10-CM | POA: Diagnosis not present

## 2022-11-19 DIAGNOSIS — R42 Dizziness and giddiness: Secondary | ICD-10-CM | POA: Diagnosis not present

## 2022-11-19 DIAGNOSIS — Z0000 Encounter for general adult medical examination without abnormal findings: Secondary | ICD-10-CM | POA: Diagnosis not present

## 2022-11-26 ENCOUNTER — Ambulatory Visit: Payer: BC Managed Care – PPO | Admitting: Allergy and Immunology

## 2022-11-26 ENCOUNTER — Encounter: Payer: Self-pay | Admitting: Allergy and Immunology

## 2022-11-26 VITALS — BP 118/76 | HR 75 | Resp 16 | Ht 65.0 in | Wt 155.4 lb

## 2022-11-26 DIAGNOSIS — J3089 Other allergic rhinitis: Secondary | ICD-10-CM

## 2022-11-26 DIAGNOSIS — H10523 Angular blepharoconjunctivitis, bilateral: Secondary | ICD-10-CM

## 2022-11-26 DIAGNOSIS — L989 Disorder of the skin and subcutaneous tissue, unspecified: Secondary | ICD-10-CM | POA: Diagnosis not present

## 2022-11-26 MED ORDER — DOXYCYCLINE HYCLATE 100 MG PO TABS: ORAL_TABLET | ORAL | 0 refills | Status: DC

## 2022-11-26 MED ORDER — IPRATROPIUM BROMIDE 0.06 % NA SOLN: NASAL | 5 refills | Status: DC

## 2022-11-26 MED ORDER — RYALTRIS 665-25 MCG/ACT NA SUSP: NASAL | 5 refills | Status: DC

## 2022-11-26 MED ORDER — DESONIDE 0.05 % EX OINT: TOPICAL_OINTMENT | CUTANEOUS | 5 refills | Status: AC

## 2022-11-26 MED ORDER — LOTEPREDNOL ETABONATE 0.5 % OP SUSP: 1.0000 [drp] | Freq: Every day | OPHTHALMIC | 5 refills | Status: DC

## 2022-11-26 NOTE — Progress Notes (Signed)
[facility] [place]   Dear Cyndi Perry,  Thank you for referring Michelle Perry to the [facility] on 11/26/2022.   Below is a summation of this patient's evaluation and recommendations.  Thank you for your referral. I will keep you informed about this patient's response to treatment.   If you have any questions please do not hesitate to contact me.   Sincerely,  Jiles Prows, MD Allergy / Immunology [place]   ______________________________________________________________________    NEW PATIENT NOTE  Referring Provider: Cyndi Bender, PA-C Primary Provider: Leonides Sake, MD Date of office visit: 11/26/2022    Subjective:   Chief Complaint:  Michelle STRITE (DOB: 27-Nov-1965) is a 57 y.o. female who presents to the clinic on 11/26/2022 with a chief complaint of Allergies .     HPI: Michelle Perry presents to this clinic in evaluation of allergies.  Michelle Perry states that as a young girl Michelle Perry has always had problems with her nose and her eyes and Michelle Perry would take some antihistamines and would usually have good control of this issue.  But Michelle Perry has lost control of this issue over the course of the past 18 months or so.  Michelle Perry has required systemic steroids twice in 2023 to address this issue.  Her symptoms include nasal congestion and some sneezing and constant runny nose and irritation just below her nasal orifice.  Michelle Perry also has very itchy eyes and itchy eyelids and Michelle Perry constantly rubs her eyes.  This occurs even though Michelle Perry uses an antihistamine, Singulair, Flonase, and an eyedrop.  Michelle Perry does not have any associated anosmia or ugly nasal discharge or recurrent headaches.  Past Medical History:  Diagnosis Date   Arthritis    Diabetes mellitus without complication ([place])    GERD (gastroesophageal reflux disease)    Hypertension    Pneumonia     Past  Surgical History:  Procedure Laterality Date   ABDOMINAL HYSTERECTOMY     LAPAROSCOPIC GASTRIC SLEEVE RESECTION N/A 09/12/2021   Procedure: LAPAROSCOPIC GASTRIC SLEEVE RESECTION;  Surgeon: Mickeal Skinner, MD;  Location: [facility];  Service: General;  Laterality: N/A;   left foot surgery      left knee surgery      right carpal tunnel release     right shoulder surgery      TUBAL LIGATION     UPPER GI ENDOSCOPY N/A 09/12/2021   Procedure: UPPER GI ENDOSCOPY;  Surgeon: Mickeal Skinner, MD;  Location: [facility];  Service: General;  Laterality: N/A;    Allergies as of 11/26/2022       Reactions   Percocet [oxycodone-acetaminophen]    vertigo        Medication List    acetaminophen 500 MG tablet Commonly known as: TYLENOL Take 1,000 mg by mouth every 6 (six) hours as needed for moderate pain. Max 6 tabs per 24 hours   atorvastatin 10 MG tablet Commonly known as: LIPITOR Take 10 mg by mouth at bedtime.   bismuth subsalicylate [id number] suspension Commonly known as: PEPTO BISMOL Take 30 mLs by mouth every 6 (six) hours as needed for diarrhea or loose stools or indigestion.   celecoxib 100 MG capsule Commonly known as: CELEBREX Take 100 mg by mouth 2 (two) times daily.   cholecalciferol 25 MCG (1000 UNIT) tablet Commonly known as: VITAMIN D3 Take 1,000 Units by mouth daily.  estradiol 0.1 MG/GM vaginal cream Commonly known as: ESTRACE 1 Applicatorful 2 (two) times a week.   EUFLEXXA IX Inject 1 Dose into the articular space See admin instructions. Pt receives 1 dose every 6 to 12 months   ferrous sulfate 325 (65 FE) MG tablet Take 325 mg by mouth daily.   Fish Oil 1200 MG Caps Take 1,200 mg by mouth daily.   fluticasone 50 MCG/ACT nasal spray Commonly known as: FLONASE Place 2 sprays into both nostrils daily as needed for allergies or rhinitis.   HAIR SKIN & NAILS GUMMIES PO Take 2 capsules by mouth daily.   HYDROcodone-acetaminophen 5-325 MG  tablet Commonly known as: NORCO/VICODIN Take 1 tablet by mouth every 4 (four) hours as needed for moderate pain.   ICY HOT MAX LIDOCAINE EX Apply 1 application topically daily as needed (pain).   Lidoderm 5 % Generic drug: lidocaine 1 patch daily as needed (pain).   losartan-hydrochlorothiazide 50-12.5 MG tablet Commonly known as: HYZAAR Take 1 tablet by mouth daily.   Lysine HCl 500 MG Tabs Take 500 mg by mouth daily.   Melatonin 1 MG Caps Take 0.5 mg by mouth at bedtime.   metFORMIN 500 MG tablet Commonly known as: GLUCOPHAGE Take 500 mg by mouth daily.   Mounjaro 7.5 MG/0.5ML Pen Generic drug: tirzepatide Inject 7.5 mg into the skin every Monday.   multivitamin with minerals Tabs tablet Take 1 tablet by mouth daily.   omeprazole 40 MG capsule Commonly known as: PRILOSEC Take 40 mg by mouth daily.   ondansetron 4 MG disintegrating tablet Commonly known as: ZOFRAN-ODT Dissolve 1 tablet (4 mg total) by mouth every 6 (six) hours as needed for nausea or vomiting.   Osteo Bi-Flex Adv Joint Shield Tabs Take 1 tablet by mouth daily.   pregabalin 50 MG capsule Commonly known as: LYRICA Take 50 mg by mouth 2 (two) times daily.   PROBIOTIC PO Take 1 capsule by mouth daily.   tiZANidine 4 MG tablet Commonly known as: ZANAFLEX Take 2-4 mg by mouth daily as needed for muscle spasms.      Review of systems negative except as noted in HPI / PMHx or noted below:  Review of Systems  Constitutional: Negative.   HENT: Negative.    Eyes: Negative.   Respiratory: Negative.    Cardiovascular: Negative.   Gastrointestinal: Negative.   Genitourinary: Negative.   Musculoskeletal: Negative.   Skin: Negative.   Neurological: Negative.   Endo/Heme/Allergies: Negative.   Psychiatric/Behavioral: Negative.      History reviewed. No pertinent family history.  Social History   Socioeconomic History   Marital status: Married    Spouse name: Not on file   Number of  children: Not on file   Years of education: Not on file   Highest education level: Not on file  Occupational History   Not on file  Tobacco Use   Smoking status: Former   Smokeless tobacco: Never  Vaping Use   Vaping Use: Never used  Substance and Sexual Activity   Alcohol use: Yes    Comment: rare   Drug use: Never   Sexual activity: Not on file  Other Topics Concern   Not on file  Social History Narrative   Not on file   Environmental and Social history  Lives in a house with a dry environment, a dog located inside the household for the past 4 years, carpet in the bedroom, no plastic on the bed, no plastic on the pillow, and  no smoking ongoing with inside the household.  Michelle Perry works as a Designer, multimedia.  Objective:   Vitals:   11/26/22 0940  BP: 118/76  Pulse: 75  Resp: 16  SpO2: 98%   Height: '5\' 5"'$  (165.1 cm) Weight: 155 lb 6.4 oz (70.5 kg)  Physical Exam Constitutional:      Appearance: Michelle Perry is not diaphoretic.  HENT:     Head: Normocephalic.     Right Ear: Tympanic membrane, ear canal and external ear normal.     Left Ear: Tympanic membrane, ear canal and external ear normal.     Nose: Nose normal. No mucosal edema or rhinorrhea.     Mouth/Throat:     Pharynx: Uvula midline. No oropharyngeal exudate.  Eyes:     Conjunctiva/sclera: Conjunctivae normal.     Comments: Scaly erythematous indurated eyelid with slightly crusty material in her inner upper eyelid bilaterally  Neck:     Thyroid: No thyromegaly.     Trachea: Trachea normal. No tracheal tenderness or tracheal deviation.  Cardiovascular:     Rate and Rhythm: Normal rate and regular rhythm.     Heart sounds: Normal heart sounds, S1 normal and S2 normal. No murmur heard. Pulmonary:     Effort: No respiratory distress.     Breath sounds: Normal breath sounds. No stridor. No wheezing or rales.  Lymphadenopathy:     Head:     Right side of head: No tonsillar adenopathy.     Left side of head: No tonsillar  adenopathy.     Cervical: No cervical adenopathy.  Skin:    Findings: No erythema or rash.     Nails: There is no clubbing.  Neurological:     Mental Status: Michelle Perry is alert.     Diagnostics: Allergy skin tests were performed. Michelle Perry did not demonstrate any hypersensitivity to a screening panel of aeroallergens  Assessment and Plan:    1. Perennial allergic rhinitis   2. Angular blepharoconjunctivitis of both eyes   3. Inflammatory dermatosis    1.  Allergen avoidance measures???  2.  Do not rub or touch eyes or nose  3.  Treat inflammation of airway:   A. Ryaltris - 2 sprays each nostril 2 times per day  4.  Treat inflammation of eyelid and eye :   A. Water, followed by desonide ointment 1 time per day  B. Lotemax - 1 drop each eye 1 time per day  5.  Treat infection of eyelids:   A. Doxycycline 100 - 1 tablet 2 times per day x 10 days, then daily until return  6.  If needed:   A. Ipratropium 0.06% - 2 sprays each nostril every 6 hours to dry nose  B. Cetirizine 10 mg - 1 tablet 1 time per day  7. Does Singulair / montelukast help???  8. Return to clinic in 4 weeks or earlier if problem  Michelle Perry has significant inflammation of her upper airway, conjunctiva, eyelids and I suspect that Michelle Perry has a secondary infection of her eyelid secondary to the continuous itching and rubbing of her eyes.  We will treat her with anti-inflammatory agents for both her upper airway and her eye and I have given her doxycycline to use for the next 4 weeks.  I am not really sure that Singulair has helped her very much and Michelle Perry can make a determination about whether or not Michelle Perry needs to continue this agent.  I have given her some nasal ipratropium for her chronic rhinorrhea.  Further evaluation and treatment will be based upon her response at 4 weeks.   Jiles Prows, MD Allergy / Immunology [facility]

## 2022-11-26 NOTE — Patient Instructions (Addendum)
  1.  Allergen avoidance measures???  2.  Do not rub or touch eyes or nose  3.  Treat inflammation of airway:   A. Ryaltris - 2 sprays each nostril 2 times per day  4.  Treat inflammation of eyelid and eye :   A. Water, followed by desonide ointment 1 time per day  B. Lotemax - 1 drop each eye 1 time per day  5.  Treat infection of eyelids:   A. Doxycycline 100 - 1 tablet 2 times per day x 10 days, then daily until return  6.  If needed:   A. Ipratropium 0.06% - 2 sprays each nostril every 6 hours to dry nose  B. Cetirizine 10 mg - 1 tablet 1 time per day  7. Does Singulair / montelukast help???  8. Return to clinic in 4 weeks or earlier if problem

## 2022-11-27 ENCOUNTER — Encounter: Payer: Self-pay | Admitting: Allergy and Immunology

## 2022-11-30 DIAGNOSIS — M47816 Spondylosis without myelopathy or radiculopathy, lumbar region: Secondary | ICD-10-CM | POA: Diagnosis not present

## 2022-12-04 DIAGNOSIS — L578 Other skin changes due to chronic exposure to nonionizing radiation: Secondary | ICD-10-CM | POA: Diagnosis not present

## 2022-12-04 DIAGNOSIS — L821 Other seborrheic keratosis: Secondary | ICD-10-CM | POA: Diagnosis not present

## 2022-12-04 DIAGNOSIS — L814 Other melanin hyperpigmentation: Secondary | ICD-10-CM | POA: Diagnosis not present

## 2022-12-04 DIAGNOSIS — D225 Melanocytic nevi of trunk: Secondary | ICD-10-CM | POA: Diagnosis not present

## 2022-12-18 ENCOUNTER — Encounter: Payer: Self-pay | Admitting: Skilled Nursing Facility1

## 2022-12-18 ENCOUNTER — Encounter: Payer: BC Managed Care – PPO | Attending: General Surgery | Admitting: Skilled Nursing Facility1

## 2022-12-18 VITALS — Ht 65.0 in | Wt 158.8 lb

## 2022-12-18 DIAGNOSIS — E669 Obesity, unspecified: Secondary | ICD-10-CM | POA: Diagnosis not present

## 2022-12-18 DIAGNOSIS — E1169 Type 2 diabetes mellitus with other specified complication: Secondary | ICD-10-CM | POA: Insufficient documentation

## 2022-12-18 DIAGNOSIS — Z713 Dietary counseling and surveillance: Secondary | ICD-10-CM | POA: Insufficient documentation

## 2022-12-18 NOTE — Progress Notes (Signed)
Follow-up visit:  Post-Operative Sleeve Surgery  Primary concerns today: Post-operative Bariatric Surgery Nutrition Management   Anthropometrics  Surgery date: 09/12/2021 Surgery type: sleeve Start weight at [facility]: 264 Weight today: 158.8 lbs   Body Composition Scale 11/07/2021 02/07/2022 12/18/2022  Weight  lbs 232.3 183.1 158.8  Total Body Fat  % 43.6 36.9 32.1     Visceral Fat 15 10 8   Fat-Free Mass  % 56.3 63 67.8     Total Body Water  % 42.6 46 48.4     Muscle-Mass  lbs 31.4 30.8 30.4  BMI 38.3 30.1 26.1  Body Fat Displacement ---          Torso  lbs 62.7 41.7 31.5        Left Leg  lbs 12.5 8.3 6.3        Right Leg  lbs 12.5 8.3 6.3        Left Arm  lbs 6.2 4.1 3.1        Right Arm  lbs 6.2 4.1 3.1   Clinical  Medical hx: DM, HTN, hypercholesterolemia  Medications: stopped her diabetes medications  Labs: A1C 6.5, iron saturation 14, b12 1935 (took multivitmain before lab was taken)  Notable signs/symptoms: knee pain Any previous deficiencies? No  Pt arrives with temple wasting.  Pt states she is having a lot of hair loss has started a protein shake but still having the same hair loss. Pt states she wants to lose 10 more pounds.  Pt states she has not been checking her blood sugars. Pt states she does feel shaky and sweaty sometimes as well as dizziness.    24 hr recall: First meal: no fat greek yogurt + granola + berries Snack: None.  Second meal 11:30-12: protein  + veggies + rice (not finishing the 2 T of rice) Snack 3: 3/4 cup: chick pea, nuts, and flavored peas or yogurt with blueberries and granola  Third meal 8: meat and vegetables and rice or potato  Snack: sometimes sugar free popsicle   Beverages: water, 1 Lipton diet tea (16 oz)  Fluid intake: 64 oz   Medications: See List Supplementation: appropriate   Using straws: no Drinking while eating: no Having you been chewing well: yes Chewing/swallowing difficulties: no Changes in vision: no Changes  to mood/headaches: no Hair loss/Cahnges to skin/Changes to nails: no Any difficulty focusing or concentrating: no Sweating: no Dizziness/Lightheaded: no Palpitations: no  Carbonated beverages: no N/V/D/C/GAS: no Abdominal Pain: no Dumping syndrome: no  Recent physical activity: yoga, weight lifting, and cardio 3-4 times a week.   Progress Towards Goal(s):  In Progress Teaching method utilized: Visual & Auditory  Demonstrated degree of understanding via: Teach Back  Readiness Level: Action Barriers to learning/adherence to lifestyle change: none identified  Education Topics: Encouraged patient to honor their body's internal hunger and fullness cues.  Throughout the day, check in mentally and rate hunger. Stop eating when satisfied not full regardless of how much food is left on the plate.  Get more if still hungry 20-30 minutes later.  The key is to honor satisfaction so throughout the meal, rate fullness factor and stop when comfortably satisfied not physically full. The key is to honor hunger and fullness without any feelings of guilt or shame.  Pay attention to what the internal cues are, rather than any external factors. This will enhance the confidence you have in listening to your own body and following those internal cues enabling you to increase how often you eat  when you are hungry not out of appetite and stop when you are satisfied not full.  Encouraged pt to continue to eat balanced meals inclusive of non starchy vegetables 2 times a day 7 days a week Encouraged pt to choose lean protein sources: limiting beef, pork, sausage, hotdogs, and lunch meat Encourage pt to choose healthy fats such as plant based limiting animal fats Encouraged pt to continue to drink a minium 64 fluid ounces with half being plain water to satisfy proper hydration  Why you need complex carbohydrates: Whole grains and other complex carbohydrates are required to have a healthy diet. Whole grains provide fiber  which can help with blood glucose levels and help keep you satiated. Fruits and starchy vegetables provide essential vitamins and minerals required for immune function, eyesight support, brain support, bone density, wound healing and many other functions within the body. According to the current evidenced based 2020-2025 Dietary Guidelines for Americans, complex carbohydrates are part of a healthy eating pattern which is associated with a decreased risk for type 2 diabetes, cancers, and cardiovascular disease.  Plant based Programmer, multimedia of balanced and diverse meals to increase the intake of nutrient-rich foods that provide essential vitamins, minerals, fiber, and phytonutrients Variety of Fruits and Vegetables: Aim for a colorful array of fruits and vegetables to ensure a wide range of nutrients. Include a mix of leafy greens, berries, citrus fruits, cruciferous vegetables, and more. Whole Grains: Choose whole grains over refined grains. Examples include brown rice, quinoa, oats, whole wheat, and barley. Lean Proteins: Include lean sources of protein, such as poultry, fish, tofu, legumes, beans, lentils, and low-fat dairy products. Limit red and processed meats. Healthy Fats: Incorporate sources of healthy fats, including avocados, nuts, seeds, and olive oil. Limit saturated and trans fats found in fried and processed foods. Dairy or Dairy Alternatives: Choose low-fat or fat-free dairy products, or plant-based alternatives like almond or soy milk. Portion Control: Be mindful of portion sizes to avoid overeating. Pay attention to hunger and satisfaction cues. Limit Added Sugars: Minimize the consumption of sugary beverages, snacks, and desserts. Check food labels for added sugars and opt for natural sources of sweetness such as whole fruits. Hydration: Drink plenty of water throughout the day. Limit sugary drinks and excessive caffeine intake. Moderate Sodium Intake: Reduce the  consumption of high-sodium foods. Use herbs and spices for flavor instead of excessive salt. Meal Planning and Preparation: Plan and prepare meals ahead of time to make healthier choices more convenient. Include a mix of food groups in each meal. Limit Processed Foods: Minimize the intake of highly processed and packaged foods that are often high in added sugars, salt, and unhealthy fats. Regular Physical Activity: Combine a healthy diet with regular physical activity for overall well-being. Aim for at least 150 minutes of moderate-intensity aerobic exercise per week, along with strength training. Moderation and Balance: Enjoy treats and indulgent foods in moderation, emphasizing balance rather than strict restriction.   Goals: -choose full fat yogurt -have 2 servings of the 3/4 cup nut mixture per day -never skip a meal -check your blood pressure daily  -start taking blood sugar again   Teaching Method Utilized:  Visual Auditory Hands on  Demonstrated degree of understanding via:  Teach Back   Monitoring/Evaluation:  Dietary intake, exercise, and body weight. Follow-up in 6 weeks.

## 2022-12-26 ENCOUNTER — Ambulatory Visit: Payer: BC Managed Care – PPO | Admitting: Allergy and Immunology

## 2022-12-31 ENCOUNTER — Other Ambulatory Visit: Payer: Self-pay | Admitting: Allergy and Immunology

## 2022-12-31 DIAGNOSIS — M47816 Spondylosis without myelopathy or radiculopathy, lumbar region: Secondary | ICD-10-CM | POA: Diagnosis not present

## 2023-01-06 ENCOUNTER — Other Ambulatory Visit: Payer: Self-pay | Admitting: Allergy and Immunology

## 2023-01-07 DIAGNOSIS — H2513 Age-related nuclear cataract, bilateral: Secondary | ICD-10-CM | POA: Diagnosis not present

## 2023-01-07 DIAGNOSIS — H524 Presbyopia: Secondary | ICD-10-CM | POA: Diagnosis not present

## 2023-01-07 DIAGNOSIS — Z7985 Long-term (current) use of injectable non-insulin antidiabetic drugs: Secondary | ICD-10-CM | POA: Diagnosis not present

## 2023-01-07 DIAGNOSIS — E1136 Type 2 diabetes mellitus with diabetic cataract: Secondary | ICD-10-CM | POA: Diagnosis not present

## 2023-01-17 ENCOUNTER — Ambulatory Visit (INDEPENDENT_AMBULATORY_CARE_PROVIDER_SITE_OTHER): Payer: BC Managed Care – PPO | Admitting: Allergy and Immunology

## 2023-01-17 ENCOUNTER — Encounter: Payer: Self-pay | Admitting: Allergy and Immunology

## 2023-01-17 VITALS — BP 110/60 | HR 72 | Resp 16

## 2023-01-17 DIAGNOSIS — L989 Disorder of the skin and subcutaneous tissue, unspecified: Secondary | ICD-10-CM | POA: Diagnosis not present

## 2023-01-17 DIAGNOSIS — J3089 Other allergic rhinitis: Secondary | ICD-10-CM | POA: Diagnosis not present

## 2023-01-17 DIAGNOSIS — B9789 Other viral agents as the cause of diseases classified elsewhere: Secondary | ICD-10-CM

## 2023-01-17 DIAGNOSIS — H10503 Unspecified blepharoconjunctivitis, bilateral: Secondary | ICD-10-CM

## 2023-01-17 DIAGNOSIS — J988 Other specified respiratory disorders: Secondary | ICD-10-CM

## 2023-01-17 NOTE — Progress Notes (Signed)
[place] - [place] - [place] - [place] - [place]   Follow-up Note  Referring Provider: Ailene Ravel, MD Primary Provider: Ailene Ravel, MD Date of Office Visit: 01/17/2023  Subjective:   Michelle Perry (DOB: 02-Nov-1965) is a 56 y.o. female who [facility] in re-evaluation of the following:  HPI: Michelle Perry returns to this clinic in evaluation of allergic rhinoconjunctivitis and possible infectious blepharoconjunctivitis assessed during her initial evaluation 26 November 2022.  She is really doing so much better especially involving her eyes.  She used her doxycycline for 10 days and she has been consistently using her low potency topical steroid and eyelid steroid.  She has very little problems with her nose.  She was using combination nasal steroid/antihistamine and also using nasal ipratropium and was very happy with the response she received regarding that issue.  1 week ago she developed some nosebleeding and scratchy throat and some left ear fullness and a little bit of drainage from her eyes.  Allergies as of 01/17/2023       Reactions   Percocet [oxycodone-acetaminophen]    vertigo        Medication List    atorvastatin 10 MG tablet Commonly known as: LIPITOR Take 10 mg by mouth at bedtime.   celecoxib 200 MG capsule Commonly known as: CELEBREX Take 200 mg by mouth daily.   cholecalciferol 25 MCG (1000 UNIT) tablet Commonly known as: VITAMIN D3 Take 1,000 Units by mouth daily.   desonide 0.05 % ointment Commonly known as: DESOWEN Water followed by ointment 1 time per day   doxycycline 100 MG tablet Commonly known as: VIBRA-TABS 1 tablet 2 times per day x 10 days, then daily until return   estradiol 1 MG tablet Commonly known as: ESTRACE Take 1 mg by mouth daily.   EUFLEXXA IX Inject 1 Dose into the articular space See admin instructions. Pt receives 1 dose every 6 to 12 months   fexofenadine  180 MG tablet Commonly known as: ALLEGRA Take 180 mg by mouth daily.   fluticasone 50 MCG/ACT nasal spray Commonly known as: FLONASE Place 2 sprays into both nostrils daily as needed for allergies or rhinitis.   ipratropium 0.06 % nasal spray Commonly known as: ATROVENT 2 SPRAYS EACH NOSTRIL EVERY 6 HOURS TO DRY NOSE   Lidoderm 5 % Generic drug: lidocaine 1 patch daily as needed (pain).   losartan-hydrochlorothiazide 50-12.5 MG tablet Commonly known as: HYZAAR Take 1 tablet by mouth daily.   loteprednol 0.5 % ophthalmic suspension Commonly known as: LOTEMAX Place 1 drop into both eyes daily in the afternoon.   Melatonin 1 MG Caps Take 0.5 mg by mouth at bedtime.   montelukast 10 MG tablet Commonly known as: SINGULAIR Take 10 mg by mouth at bedtime.   Mounjaro 7.5 MG/0.5ML Pen Generic drug: tirzepatide Inject 7.5 mg into the skin every Monday.   olopatadine 0.1 % ophthalmic solution Commonly known as: PATANOL 1 drop 2 (two) times daily.   omeprazole 40 MG capsule Commonly known as: PRILOSEC Take 40 mg by mouth daily.   pregabalin 50 MG capsule Commonly known as: LYRICA Take 50 mg by mouth 2 (two) times daily.   Ryaltris 161-09 MCG/ACT Susp Generic drug: Olopatadine-Mometasone 2 sprays each nostril 2 times per day   tiZANidine 4 MG tablet Commonly known as: ZANAFLEX Take 2-4 mg by mouth daily as needed for muscle spasms.    Past Medical History:  Diagnosis Date  Arthritis    Diabetes mellitus without complication    GERD (gastroesophageal reflux disease)    Hypertension    Pneumonia     Past Surgical History:  Procedure Laterality Date   ABDOMINAL HYSTERECTOMY     LAPAROSCOPIC GASTRIC SLEEVE RESECTION N/A 09/12/2021   Procedure: LAPAROSCOPIC GASTRIC SLEEVE RESECTION;  Surgeon: Rodman Pickle, MD;  Location: [facility];  Service: General;  Laterality: N/A;   left foot surgery      left knee surgery      right carpal tunnel release     right  shoulder surgery      TUBAL LIGATION     UPPER GI ENDOSCOPY N/A 09/12/2021   Procedure: UPPER GI ENDOSCOPY;  Surgeon: Sheliah Hatch De Blanch, MD;  Location: [facility];  Service: General;  Laterality: N/A;    Review of systems negative except as noted in HPI / PMHx or noted below:  Review of Systems  Constitutional: Negative.   HENT: Negative.    Eyes: Negative.   Respiratory: Negative.    Cardiovascular: Negative.   Gastrointestinal: Negative.   Genitourinary: Negative.   Musculoskeletal: Negative.   Skin: Negative.   Neurological: Negative.   Endo/Heme/Allergies: Negative.   Psychiatric/Behavioral: Negative.       Objective:   Vitals:   01/17/23 0850  BP: 110/60  Pulse: 72  Resp: 16  SpO2: 98%          Physical Exam Constitutional:      Appearance: She is not diaphoretic.  HENT:     Head: Normocephalic.     Right Ear: Tympanic membrane, ear canal and external ear normal.     Left Ear: Tympanic membrane, ear canal and external ear normal.     Nose: Nose normal. No mucosal edema or rhinorrhea.     Mouth/Throat:     Pharynx: Uvula midline. No oropharyngeal exudate.  Eyes:     Conjunctiva/sclera: Conjunctivae normal.  Neck:     Thyroid: No thyromegaly.     Trachea: Trachea normal. No tracheal tenderness or tracheal deviation.  Cardiovascular:     Rate and Rhythm: Normal rate and regular rhythm.     Heart sounds: Normal heart sounds, S1 normal and S2 normal. No murmur heard. Pulmonary:     Effort: No respiratory distress.     Breath sounds: Normal breath sounds. No stridor. No wheezing or rales.  Lymphadenopathy:     Head:     Right side of head: No tonsillar adenopathy.     Left side of head: No tonsillar adenopathy.     Cervical: No cervical adenopathy.  Skin:    Findings: No erythema or rash.     Nails: There is no clubbing.  Neurological:     Mental Status: She is alert.     Diagnostics: none  Assessment and Plan:   1. Perennial allergic rhinitis    2. Inflammatory dermatosis   3. Blepharoconjunctivitis of both eyes, unspecified blepharoconjunctivitis type   4. Viral respiratory illness     1.  Do not rub or touch eyes or nose  2.  Continue to treat inflammation of airway:   A. Ryaltris - 2 sprays each nostril 1-2 times per day  3.  Continue to treat inflammation of eyelid and eye :   A. Water, followed by desonide ointment 3-7 time per week  B. Lotemax - 1 drop each eye 3-7 times per week  4.  If needed:   A. Ipratropium 0.06% - 2 sprays each nostril every 6  hours to dry nose  B. Cetirizine 10 mg - 1 tablet 1 time per day  5. For this recent event, assume viral infection:   A. Minimize Ryaltris and ipratropium use  B. Can add nasal saline if nose irritated  6. Return to clinic in 6 months or earlier if problem  Michelle Perry is doing a lot better and we will decrease her nasal antihistamine/steroid formulation to just once a day.  She will continue on low-dose of Lotemax which has really helped her eye significantly.  Will hold off on having her use any more doxycycline at this point.  It appears she has a little viral infection that should be resolved within the next week or so.  I will see her back in this clinic in 6 months or earlier if there is a problem.  Laurette Schimke, MD Allergy / Immunology [facility]

## 2023-01-17 NOTE — Patient Instructions (Addendum)
  1.  Do not rub or touch eyes or nose  2.  Continue to treat inflammation of airway:   A. Ryaltris - 2 sprays each nostril 1-2 times per day  3.  Continue to treat inflammation of eyelid and eye :   A. Water, followed by desonide ointment 3-7 time per week  B. Lotemax - 1 drop each eye 3-7 times per week  4.  If needed:   A. Ipratropium 0.06% - 2 sprays each nostril every 6 hours to dry nose  B. Cetirizine 10 mg - 1 tablet 1 time per day  5. For this recent event, assume viral infection:   A. Minimize Ryaltris and ipratropium use  B. Can add nasal saline if nose irritated  6. Return to clinic in 6 months or earlier if problem

## 2023-01-21 ENCOUNTER — Encounter: Payer: Self-pay | Admitting: Allergy and Immunology

## 2023-02-13 ENCOUNTER — Encounter: Payer: Self-pay | Admitting: Skilled Nursing Facility1

## 2023-02-13 ENCOUNTER — Encounter: Payer: BC Managed Care – PPO | Attending: General Surgery | Admitting: Skilled Nursing Facility1

## 2023-02-13 VITALS — Ht 65.0 in | Wt 158.3 lb

## 2023-02-13 DIAGNOSIS — E669 Obesity, unspecified: Secondary | ICD-10-CM

## 2023-02-13 NOTE — Progress Notes (Signed)
Follow-up visit:  Post-Operative Sleeve Surgery  Primary concerns today: Post-operative Bariatric Surgery Nutrition Management   Anthropometrics  Surgery date: 09/12/2021 Surgery type: sleeve Start weight at [facility]: 264 Weight today: 158.8 lbs   Body Composition Scale 02/07/2022 12/18/2022 02/13/2023  Weight  lbs 183.1 158.8 158.3  Total Body Fat  % 36.9 32.1 32     Visceral Fat 10 8 8   Fat-Free Mass  % 63 67.8 67.9     Total Body Water  % 46 48.4 48.4     Muscle-Mass  lbs 30.8 30.4 30.4  BMI 30.1 26.1 26  Body Fat Displacement           Torso  lbs 41.7 31.5 31.3        Left Leg  lbs 8.3 6.3 6.2        Right Leg  lbs 8.3 6.3 6.2        Left Arm  lbs 4.1 3.1 3.1        Right Arm  lbs 4.1 3.1 3.1   Clinical  Medical hx: DM, HTN, hypercholesterolemia  Medications: stopped her diabetes medications  Labs: A1C 6.5, iron saturation 14, b12 1935 (took multivitmain before lab was taken)  Notable signs/symptoms: knee pain, back pain Any previous deficiencies? No  Pt arrives with temple wasting.   Pt states she has not been checking her blood sugars.  Pt arrives with her hair a bit thicker than last visit.   Pt states previous to including enough snacks she was getting dizzy after yoga but now has not had those issues.  Pt states she hates full fat yogurt.    24 hr recall: First meal: bagel mini peanut butter and jelly or protein cereal  Snack: None.  Second meal 11:30-1: 3 in sub Snack 2: sugar free jello and chocolate chips or  Snack: 5pm: 3 T greek yogurt and granola and berries Third meal 8: 3 in sub  Snack:   Beverages: water, 1 Lipton diet tea (16 oz), protein water  Fluid intake: 64 oz   Medications: See List Supplementation: appropriate   Using straws: no Drinking while eating: no Having you been chewing well: yes Chewing/swallowing difficulties: no Changes in vision: no Changes to mood/headaches: no Hair loss/Cahnges to skin/Changes to nails: no Any  difficulty focusing or concentrating: no Sweating: no Dizziness/Lightheaded: no Palpitations: no  Carbonated beverages: no N/V/D/C/GAS: no Abdominal Pain: no Dumping syndrome: no  Recent physical activity: yoga, weight lifting, and cardio 6 times a week.   Progress Towards Goal(s):  In Progress Teaching method utilized: Visual & Auditory  Demonstrated degree of understanding via: Teach Back  Readiness Level: Action Barriers to learning/adherence to lifestyle change: none identified  Education Topics: Encouraged patient to honor their body's internal hunger and fullness cues.  Throughout the day, check in mentally and rate hunger. Stop eating when satisfied not full regardless of how much food is left on the plate.  Get more if still hungry 20-30 minutes later.  The key is to honor satisfaction so throughout the meal, rate fullness factor and stop when comfortably satisfied not physically full. The key is to honor hunger and fullness without any feelings of guilt or shame.  Pay attention to what the internal cues are, rather than any external factors. This will enhance the confidence you have in listening to your own body and following those internal cues enabling you to increase how often you eat when you are hungry not out of appetite and stop when  you are satisfied not full.  Encouraged pt to continue to eat balanced meals inclusive of non starchy vegetables 2 times a day 7 days a week Encouraged pt to choose lean protein sources: limiting beef, pork, sausage, hotdogs, and lunch meat Encourage pt to choose healthy fats such as plant based limiting animal fats Encouraged pt to continue to drink a minium 64 fluid ounces with half being plain water to satisfy proper hydration  Why you need complex carbohydrates: Whole grains and other complex carbohydrates are required to have a healthy diet. Whole grains provide fiber which can help with blood glucose levels and help keep you satiated. Fruits  and starchy vegetables provide essential vitamins and minerals required for immune function, eyesight support, brain support, bone density, wound healing and many other functions within the body. According to the current evidenced based 2020-2025 Dietary Guidelines for Americans, complex carbohydrates are part of a healthy eating pattern which is associated with a decreased risk for type 2 diabetes, cancers, and cardiovascular disease.  Plant based Copywriter, advertising of balanced and diverse meals to increase the intake of nutrient-rich foods that provide essential vitamins, minerals, fiber, and phytonutrients Variety of Fruits and Vegetables: Aim for a colorful array of fruits and vegetables to ensure a wide range of nutrients. Include a mix of leafy greens, berries, citrus fruits, cruciferous vegetables, and more. Whole Grains: Choose whole grains over refined grains. Examples include brown rice, quinoa, oats, whole wheat, and barley. Lean Proteins: Include lean sources of protein, such as poultry, fish, tofu, legumes, beans, lentils, and low-fat dairy products. Limit red and processed meats. Healthy Fats: Incorporate sources of healthy fats, including avocados, nuts, seeds, and olive oil. Limit saturated and trans fats found in fried and processed foods. Dairy or Dairy Alternatives: Choose low-fat or fat-free dairy products, or plant-based alternatives like almond or soy milk. Portion Control: Be mindful of portion sizes to avoid overeating. Pay attention to hunger and satisfaction cues. Limit Added Sugars: Minimize the consumption of sugary beverages, snacks, and desserts. Check food labels for added sugars and opt for natural sources of sweetness such as whole fruits. Hydration: Drink plenty of water throughout the day. Limit sugary drinks and excessive caffeine intake. Moderate Sodium Intake: Reduce the consumption of high-sodium foods. Use herbs and spices for flavor instead of  excessive salt. Meal Planning and Preparation: Plan and prepare meals ahead of time to make healthier choices more convenient. Include a mix of food groups in each meal. Limit Processed Foods: Minimize the intake of highly processed and packaged foods that are often high in added sugars, salt, and unhealthy fats. Regular Physical Activity: Combine a healthy diet with regular physical activity for overall well-being. Aim for at least 150 minutes of moderate-intensity aerobic exercise per week, along with strength training. Moderation and Balance: Enjoy treats and indulgent foods in moderation, emphasizing balance rather than strict restriction.   Goals: -keep up all of your snacks and meals; great job!   Teaching Method Utilized:  Visual Auditory Hands on  Demonstrated degree of understanding via:  Teach Back   Monitoring/Evaluation:  Dietary intake, exercise, and body weight. Follow-up in 3 months

## 2023-04-02 DIAGNOSIS — M1712 Unilateral primary osteoarthritis, left knee: Secondary | ICD-10-CM | POA: Diagnosis not present

## 2023-04-02 DIAGNOSIS — M533 Sacrococcygeal disorders, not elsewhere classified: Secondary | ICD-10-CM | POA: Diagnosis not present

## 2023-04-03 ENCOUNTER — Ambulatory Visit: Payer: BC Managed Care – PPO | Admitting: Allergy and Immunology

## 2023-04-03 ENCOUNTER — Encounter: Payer: Self-pay | Admitting: Allergy and Immunology

## 2023-04-03 VITALS — BP 112/78 | HR 77 | Resp 16

## 2023-04-03 DIAGNOSIS — H04123 Dry eye syndrome of bilateral lacrimal glands: Secondary | ICD-10-CM | POA: Diagnosis not present

## 2023-04-03 DIAGNOSIS — J3089 Other allergic rhinitis: Secondary | ICD-10-CM | POA: Diagnosis not present

## 2023-04-03 DIAGNOSIS — H10503 Unspecified blepharoconjunctivitis, bilateral: Secondary | ICD-10-CM | POA: Diagnosis not present

## 2023-04-03 DIAGNOSIS — L989 Disorder of the skin and subcutaneous tissue, unspecified: Secondary | ICD-10-CM

## 2023-04-03 MED ORDER — DOXYCYCLINE HYCLATE 100 MG PO CAPS: 100.0000 mg | ORAL_CAPSULE | Freq: Every day | ORAL | 5 refills | Status: DC

## 2023-04-03 NOTE — Patient Instructions (Addendum)
  1. Do not rub or touch eyes or nose  2. Do not use any oral antihistamines  3. Start doxycycline 100 mg - 1 tablet 1 time per day, every day  4. Before bedtime use Systane GEL drop  5. Upon awakening, use Systane drops   6.  Continue to treat inflammation of airway:   A. Ryaltris - 2 sprays each nostril 2 times per day  7.  Continue to treat inflammation of eyelid and eye :   A. Water, followed by desonide ointment 3-7 time per week  B. Loteprednol  0.5%- 1 drop each eye 3-7 times per week  C. Desonide 0.05% ointment - apply to eyelids 3-7 times per week  8.  If needed:   A. Ipratropium 0.06% - 2 sprays each nostril every 6 hours to dry nose  9. Return to clinic in 6 months or earlier if problem  10. Plan for fall flu vaccine

## 2023-04-03 NOTE — Progress Notes (Signed)
[place] - [place] - [place] - [place] - [place]   Follow-up Note  Referring Provider: Ailene Ravel, MD Primary Provider: Ailene Ravel, MD Date of Office Visit: 04/03/2023  Subjective:   Michelle Perry (DOB: 09/20/1966) is a 57 y.o. female who [facility]23 in re-evaluation of the following:  HPI: Michelle Perry returns this clinic in evaluation of allergic rhinoconjunctivitis and blepharoconjunctivitis.  I last saw her in this clinic 17 January 2023.  Her nose is doing wonderful as long she continues on Ryaltris and nasal ipratropium twice a day.  If she tapers off these medicine she has problems with her nose.  Her eyes continue to water.  She has sandy mucus in her eyes upon awakening in the morning.  She finds it somewhat hard to open her eyes in the morning.  She has been using her steroid eyedrop and 1 drop each eye 1 time per day and put some desonide ointment on her eyelids on occasion.  Allergies as of 04/03/2023       Reactions   Percocet [oxycodone-acetaminophen]    vertigo        Medication List    atorvastatin 10 MG tablet Commonly known as: LIPITOR Take 10 mg by mouth at bedtime.   celecoxib 200 MG capsule Commonly known as: CELEBREX Take 200 mg by mouth daily.   cholecalciferol 25 MCG (1000 UNIT) tablet Commonly known as: VITAMIN D3 Take 1,000 Units by mouth daily.   desonide 0.05 % ointment Commonly known as: DESOWEN Water followed by ointment 1 time per day   estradiol 1 MG tablet Commonly known as: ESTRACE Take 1 mg by mouth daily.   EUFLEXXA IX Inject 1 Dose into the articular space See admin instructions. Pt receives 1 dose every 6 to 12 months   fexofenadine 180 MG tablet Commonly known as: ALLEGRA Take 180 mg by mouth daily.   fluticasone 50 MCG/ACT nasal spray Commonly known as: FLONASE Place 2 sprays into both nostrils daily as needed for allergies or rhinitis.   ipratropium 0.06 %  nasal spray Commonly known as: ATROVENT 2 SPRAYS EACH NOSTRIL EVERY 6 HOURS TO DRY NOSE   Lidoderm 5 % Generic drug: lidocaine 1 patch daily as needed (pain).   losartan-hydrochlorothiazide 50-12.5 MG tablet Commonly known as: HYZAAR Take 1 tablet by mouth daily.   loteprednol 0.5 % ophthalmic suspension Commonly known as: LOTEMAX Place 1 drop into both eyes daily in the afternoon.   Melatonin 1 MG Caps Take 0.5 mg by mouth at bedtime.   montelukast 10 MG tablet Commonly known as: SINGULAIR Take 10 mg by mouth at bedtime.   Mounjaro 7.5 MG/0.5ML Pen Generic drug: tirzepatide Inject 7.5 mg into the skin every Monday.   olopatadine 0.1 % ophthalmic solution Commonly known as: PATANOL 1 drop 2 (two) times daily.   omeprazole 40 MG capsule Commonly known as: PRILOSEC Take 40 mg by mouth daily.   pregabalin 50 MG capsule Commonly known as: LYRICA Take 50 mg by mouth 2 (two) times daily.   Ryaltris 161-09 MCG/ACT Susp Generic drug: Olopatadine-Mometasone 2 sprays each nostril 2 times per day   tiZANidine 4 MG tablet Commonly known as: ZANAFLEX Take 2-4 mg by mouth daily as needed for muscle spasms.    Past Medical History:  Diagnosis Date   Arthritis    Diabetes mellitus without complication (HCC)    GERD (gastroesophageal reflux disease)    Hypertension    Pneumonia  Past Surgical History:  Procedure Laterality Date   ABDOMINAL HYSTERECTOMY     LAPAROSCOPIC GASTRIC SLEEVE RESECTION N/A 09/12/2021   Procedure: LAPAROSCOPIC GASTRIC SLEEVE RESECTION;  Surgeon: Rodman Pickle, MD;  Location: [facility];  Service: General;  Laterality: N/A;   left foot surgery      left knee surgery      right carpal tunnel release     right shoulder surgery      TUBAL LIGATION     UPPER GI ENDOSCOPY N/A 09/12/2021   Procedure: UPPER GI ENDOSCOPY;  Surgeon: Sheliah Hatch De Blanch, MD;  Location: [facility];  Service: General;  Laterality: N/A;    Review of systems  negative except as noted in HPI / PMHx or noted below:  Review of Systems  Constitutional: Negative.   HENT: Negative.    Eyes: Negative.   Respiratory: Negative.    Cardiovascular: Negative.   Gastrointestinal: Negative.   Genitourinary: Negative.   Musculoskeletal: Negative.   Skin: Negative.   Neurological: Negative.   Endo/Heme/Allergies: Negative.   Psychiatric/Behavioral: Negative.       Objective:   Vitals:   04/03/23 0859  BP: 112/78  Pulse: 77  Resp: 16  SpO2: 98%          Physical Exam Constitutional:      Appearance: She is not diaphoretic.  HENT:     Head: Normocephalic.     Right Ear: Tympanic membrane, ear canal and external ear normal.     Left Ear: Tympanic membrane, ear canal and external ear normal.     Nose: Nose normal. No mucosal edema or rhinorrhea.     Mouth/Throat:     Pharynx: Uvula midline. No oropharyngeal exudate.  Eyes:     Conjunctiva/sclera: Conjunctivae normal.  Neck:     Thyroid: No thyromegaly.     Trachea: Trachea normal. No tracheal tenderness or tracheal deviation.  Cardiovascular:     Rate and Rhythm: Normal rate and regular rhythm.     Heart sounds: Normal heart sounds, S1 normal and S2 normal. No murmur heard. Pulmonary:     Effort: No respiratory distress.     Breath sounds: Normal breath sounds. No stridor. No wheezing or rales.  Lymphadenopathy:     Head:     Right side of head: No tonsillar adenopathy.     Left side of head: No tonsillar adenopathy.     Cervical: No cervical adenopathy.  Skin:    Findings: Rash (facial erythema) present. No erythema.     Nails: There is no clubbing.  Neurological:     Mental Status: She is alert.     Diagnostics: none  Assessment and Plan:   1. Perennial allergic rhinitis   2. Blepharoconjunctivitis of both eyes, unspecified blepharoconjunctivitis type   3. Inflammatory dermatosis   4. Dry eye syndrome of both eyes     1. Do not rub or touch eyes or nose  2. Do not  use any oral antihistamines  3. Start doxycycline 100 mg - 1 tablet 1 time per day, every day  4. Before bedtime use Systane GEL drop  5. Upon awakening, use Systane drops   6.  Continue to treat inflammation of airway:   A. Ryaltris - 2 sprays each nostril 2 times per day  7.  Continue to treat inflammation of eyelid and eye :   A. Water, followed by desonide ointment 3-7 time per week  B. Loteprednol  0.5%- 1 drop each eye 3-7 times per week  C. Desonide 0.05% ointment - apply to eyelids 3-7 times per week  8.  If needed:   A. Ipratropium 0.06% - 2 sprays each nostril every 6 hours to dry nose  9. Return to clinic in 6 months or earlier if problem  10. Plan for fall flu vaccine  Michelle Perry will use the therapy noted above which includes consistent use of doxycycline and a low-dose of a topical steroid to her eye and eliminating all oral antihistamine use to address what appears to be an inflamed conjunctiva and eyelids secondary to dry eye syndrome and possibly other triggers.  And she will continue on her combination nasal steroid/antihistamine regarding her nasal issue which is working quite well.  I will see her back in this clinic in 6 months or earlier if there is a problem.  Laurette Schimke, MD Allergy / Immunology [facility]

## 2023-04-08 ENCOUNTER — Encounter: Payer: Self-pay | Admitting: Allergy and Immunology

## 2023-04-08 DIAGNOSIS — E119 Type 2 diabetes mellitus without complications: Secondary | ICD-10-CM | POA: Diagnosis not present

## 2023-04-08 DIAGNOSIS — M8588 Other specified disorders of bone density and structure, other site: Secondary | ICD-10-CM | POA: Diagnosis not present

## 2023-04-08 DIAGNOSIS — N958 Other specified menopausal and perimenopausal disorders: Secondary | ICD-10-CM | POA: Diagnosis not present

## 2023-04-19 DIAGNOSIS — M533 Sacrococcygeal disorders, not elsewhere classified: Secondary | ICD-10-CM | POA: Diagnosis not present

## 2023-05-20 ENCOUNTER — Encounter: Payer: BC Managed Care – PPO | Attending: General Surgery | Admitting: Skilled Nursing Facility1

## 2023-05-20 ENCOUNTER — Encounter: Payer: Self-pay | Admitting: Skilled Nursing Facility1

## 2023-05-20 ENCOUNTER — Other Ambulatory Visit: Payer: Self-pay | Admitting: Allergy and Immunology

## 2023-05-20 VITALS — Wt 157.0 lb

## 2023-05-20 DIAGNOSIS — E669 Obesity, unspecified: Secondary | ICD-10-CM | POA: Diagnosis not present

## 2023-05-20 NOTE — Progress Notes (Signed)
Follow-up visit:  Post-Operative Sleeve Surgery  Primary concerns today: Post-operative Bariatric Surgery Nutrition Management   Anthropometrics  Surgery date: 09/12/2021 Surgery type: sleeve Start weight at [facility]: 264 Weight today: 157 lbs   Body Composition Scale 12/18/2022 02/13/2023 05/20/2023  Weight  lbs 158.8 158.3 157  Total Body Fat  % 32.1 32 31.7     Visceral Fat 8 8 7   Fat-Free Mass  % 67.8 67.9 68.2     Total Body Water  % 48.4 48.4 48.6     Muscle-Mass  lbs 30.4 30.4 30.4  BMI 26.1 26 25.8  Body Fat Displacement           Torso  lbs 31.5 31.3 30.7        Left Leg  lbs 6.3 6.2 6.1        Right Leg  lbs 6.3 6.2 6.1        Left Arm  lbs 3.1 3.1 3.0        Right Arm  lbs 3.1 3.1 3.0   Clinical  Medical hx: DM, HTN, hypercholesterolemia  Medications: monjouro bp med, cholesterol med Labs: A1C 6.5, iron saturation 14, b12 1935 (took multivitmain before lab was taken)  Notable signs/symptoms: knee pain, back pain Any previous deficiencies? No   Pt state she no longer having a significant amount of hair in the shower. Pt states she needs a nap about once a week.  Pt states she gets labs shortly.  Pt states she has been eating every 3 hours and feels like it is more normal.  Pt state she needs to be sure to do the snack before the gym otherwise she will get a blood sugar drop.   Pt states she started checking her blood sugars getting around 90-100; pt states she feels shaky when her sugars are int he 80's.   24 hr recall: First meal 8am: veggie blend cheerios  Snack:  Second meal 11:30: 1/2 hamburger with patty + cantaloupe Snack 2: greek yogurt + cool whip + jello  + semi sweet chocolate chips or yogurt with fruit Snack: 5pm: some yogurt + granola and fruit Third meal 8: whole hamburger  Snack:   Beverages: water, 1 Lipton diet tea (16 oz), protein water  Fluid intake: 64 oz   Medications: See List Supplementation: appropriate   Using straws: no Drinking  while eating: no Having you been chewing well: yes Chewing/swallowing difficulties: no Changes in vision: no Changes to mood/headaches: no Hair loss/Cahnges to skin/Changes to nails: no Any difficulty focusing or concentrating: no Sweating: no Dizziness/Lightheaded: no Palpitations: no  Carbonated beverages: no N/V/D/C/GAS: no Abdominal Pain: no Dumping syndrome: no  Recent physical activity: yoga, weight lifting, and cardio 3-4 times a week.   Progress Towards Goal(s):  In Progress Teaching method utilized: Visual & Auditory  Demonstrated degree of understanding via: Teach Back  Readiness Level: Action Barriers to learning/adherence to lifestyle change: none identified  Education Topics: Encouraged patient to honor their body's internal hunger and fullness cues.  Throughout the day, check in mentally and rate hunger. Stop eating when satisfied not full regardless of how much food is left on the plate.  Get more if still hungry 20-30 minutes later.  The key is to honor satisfaction so throughout the meal, rate fullness factor and stop when comfortably satisfied not physically full. The key is to honor hunger and fullness without any feelings of guilt or shame.  Pay attention to what the internal cues are, rather than  any external factors. This will enhance the confidence you have in listening to your own body and following those internal cues enabling you to increase how often you eat when you are hungry not out of appetite and stop when you are satisfied not full.  Encouraged pt to continue to eat balanced meals inclusive of non starchy vegetables 2 times a day 7 days a week Encouraged pt to choose lean protein sources: limiting beef, pork, sausage, hotdogs, and lunch meat Encourage pt to choose healthy fats such as plant based limiting animal fats Encouraged pt to continue to drink a minium 64 fluid ounces with half being plain water to satisfy proper hydration  Why you need complex  carbohydrates: Whole grains and other complex carbohydrates are required to have a healthy diet. Whole grains provide fiber which can help with blood glucose levels and help keep you satiated. Fruits and starchy vegetables provide essential vitamins and minerals required for immune function, eyesight support, brain support, bone density, wound healing and many other functions within the body. According to the current evidenced based 2020-2025 Dietary Guidelines for Americans, complex carbohydrates are part of a healthy eating pattern which is associated with a decreased risk for type 2 diabetes, cancers, and cardiovascular disease.  Plant based Copywriter, advertising of balanced and diverse meals to increase the intake of nutrient-rich foods that provide essential vitamins, minerals, fiber, and phytonutrients Variety of Fruits and Vegetables: Aim for a colorful array of fruits and vegetables to ensure a wide range of nutrients. Include a mix of leafy greens, berries, citrus fruits, cruciferous vegetables, and more. Whole Grains: Choose whole grains over refined grains. Examples include brown rice, quinoa, oats, whole wheat, and barley. Lean Proteins: Include lean sources of protein, such as poultry, fish, tofu, legumes, beans, lentils, and low-fat dairy products. Limit red and processed meats. Healthy Fats: Incorporate sources of healthy fats, including avocados, nuts, seeds, and olive oil. Limit saturated and trans fats found in fried and processed foods. Dairy or Dairy Alternatives: Choose low-fat or fat-free dairy products, or plant-based alternatives like almond or soy milk. Portion Control: Be mindful of portion sizes to avoid overeating. Pay attention to hunger and satisfaction cues. Limit Added Sugars: Minimize the consumption of sugary beverages, snacks, and desserts. Check food labels for added sugars and opt for natural sources of sweetness such as whole fruits. Hydration: Drink plenty  of water throughout the day. Limit sugary drinks and excessive caffeine intake. Moderate Sodium Intake: Reduce the consumption of high-sodium foods. Use herbs and spices for flavor instead of excessive salt. Meal Planning and Preparation: Plan and prepare meals ahead of time to make healthier choices more convenient. Include a mix of food groups in each meal. Limit Processed Foods: Minimize the intake of highly processed and packaged foods that are often high in added sugars, salt, and unhealthy fats. Regular Physical Activity: Combine a healthy diet with regular physical activity for overall well-being. Aim for at least 150 minutes of moderate-intensity aerobic exercise per week, along with strength training. Moderation and Balance: Enjoy treats and indulgent foods in moderation, emphasizing balance rather than strict restriction.   Goals: continued  -keep up all of your snacks and meals; great job!   Teaching Method Utilized:  Visual Auditory Hands on  Demonstrated degree of understanding via:  Teach Back   Monitoring/Evaluation:  Dietary intake, exercise, and body weight. Follow-up in January

## 2023-05-21 DIAGNOSIS — Z1331 Encounter for screening for depression: Secondary | ICD-10-CM | POA: Diagnosis not present

## 2023-05-21 DIAGNOSIS — E782 Mixed hyperlipidemia: Secondary | ICD-10-CM | POA: Diagnosis not present

## 2023-05-21 DIAGNOSIS — I10 Essential (primary) hypertension: Secondary | ICD-10-CM | POA: Diagnosis not present

## 2023-05-21 DIAGNOSIS — E1169 Type 2 diabetes mellitus with other specified complication: Secondary | ICD-10-CM | POA: Diagnosis not present

## 2023-05-21 DIAGNOSIS — E785 Hyperlipidemia, unspecified: Secondary | ICD-10-CM | POA: Diagnosis not present

## 2023-05-22 DIAGNOSIS — M1712 Unilateral primary osteoarthritis, left knee: Secondary | ICD-10-CM | POA: Diagnosis not present

## 2023-05-29 DIAGNOSIS — M1712 Unilateral primary osteoarthritis, left knee: Secondary | ICD-10-CM | POA: Diagnosis not present

## 2023-06-05 DIAGNOSIS — M1712 Unilateral primary osteoarthritis, left knee: Secondary | ICD-10-CM | POA: Diagnosis not present

## 2023-06-28 DIAGNOSIS — H04123 Dry eye syndrome of bilateral lacrimal glands: Secondary | ICD-10-CM | POA: Diagnosis not present

## 2023-06-28 DIAGNOSIS — H04223 Epiphora due to insufficient drainage, bilateral lacrimal glands: Secondary | ICD-10-CM | POA: Diagnosis not present

## 2023-06-28 DIAGNOSIS — H1045 Other chronic allergic conjunctivitis: Secondary | ICD-10-CM | POA: Diagnosis not present

## 2023-06-28 DIAGNOSIS — H18592 Other hereditary corneal dystrophies, left eye: Secondary | ICD-10-CM | POA: Diagnosis not present

## 2023-06-28 DIAGNOSIS — H04222 Epiphora due to insufficient drainage, left lacrimal gland: Secondary | ICD-10-CM | POA: Diagnosis not present

## 2023-06-28 DIAGNOSIS — H04221 Epiphora due to insufficient drainage, right lacrimal gland: Secondary | ICD-10-CM | POA: Diagnosis not present

## 2023-07-18 DIAGNOSIS — H04223 Epiphora due to insufficient drainage, bilateral lacrimal glands: Secondary | ICD-10-CM | POA: Diagnosis not present

## 2023-07-18 DIAGNOSIS — H53143 Visual discomfort, bilateral: Secondary | ICD-10-CM | POA: Diagnosis not present

## 2023-07-22 ENCOUNTER — Ambulatory Visit: Payer: BC Managed Care – PPO | Admitting: Allergy and Immunology

## 2023-07-25 ENCOUNTER — Other Ambulatory Visit: Payer: Self-pay | Admitting: Allergy and Immunology

## 2023-08-21 DIAGNOSIS — M25562 Pain in left knee: Secondary | ICD-10-CM | POA: Diagnosis not present

## 2023-08-21 DIAGNOSIS — M533 Sacrococcygeal disorders, not elsewhere classified: Secondary | ICD-10-CM | POA: Diagnosis not present

## 2023-08-21 DIAGNOSIS — M47816 Spondylosis without myelopathy or radiculopathy, lumbar region: Secondary | ICD-10-CM | POA: Diagnosis not present

## 2023-09-04 ENCOUNTER — Encounter: Payer: Self-pay | Admitting: Allergy and Immunology

## 2023-09-04 ENCOUNTER — Ambulatory Visit: Payer: BC Managed Care – PPO | Admitting: Allergy and Immunology

## 2023-09-04 VITALS — BP 118/76 | HR 81 | Resp 16

## 2023-09-04 DIAGNOSIS — H10503 Unspecified blepharoconjunctivitis, bilateral: Secondary | ICD-10-CM | POA: Diagnosis not present

## 2023-09-04 DIAGNOSIS — H04123 Dry eye syndrome of bilateral lacrimal glands: Secondary | ICD-10-CM

## 2023-09-04 DIAGNOSIS — L989 Disorder of the skin and subcutaneous tissue, unspecified: Secondary | ICD-10-CM | POA: Diagnosis not present

## 2023-09-04 DIAGNOSIS — J3089 Other allergic rhinitis: Secondary | ICD-10-CM | POA: Diagnosis not present

## 2023-09-04 NOTE — Progress Notes (Unsigned)
[place] [facility] [place] - [place] - [place] - [place]   Follow-up Note  Referring Provider: Ailene Ravel, MD Primary Provider: Ailene Ravel, MD Date of Office Visit: 09/04/2023  Subjective:   Michelle Perry (DOB: [birth year]) is a 57 y.o. female who [facility] in re-evaluation of the following:  HPI: Michelle Perry returns to this clinic in evaluation of allergic rhinoconjunctivitis and rosacea blepharoconjunctivitis.  I last saw her in this clinic 03 April 2023.  During her last visit she was having significant problems with her blepharoconjunctivitis and we started her on doxycycline and had her use a topical steroid for both her eyelid and her eyeball while she continue to use a combination nasal steroid nasal antihistamine and she has done great.  She has acquired significant improvement and relief regarding her chronic eye issue and her nose issue on this current plan and she is extremely happy about the way things are going at this point.  She has obtained this years flu vaccine and COVID-vaccine.  Allergies as of 09/04/2023       Reactions   Percocet [oxycodone-acetaminophen]    vertigo        Medication List    atorvastatin 10 MG tablet Commonly known as: LIPITOR Take 10 mg by mouth at bedtime.   celecoxib 200 MG capsule Commonly known as: CELEBREX Take 200 mg by mouth daily.   cholecalciferol 25 MCG (1000 UNIT) tablet Commonly known as: VITAMIN D3 Take 1,000 Units by mouth daily.   desonide 0.05 % ointment Commonly known as: DESOWEN Water followed by ointment 1 time per day   doxycycline 100 MG capsule Commonly known as: VIBRAMYCIN Take 1 capsule (100 mg total) by mouth daily.   doxycycline 100 MG tablet Commonly known as: VIBRA-TABS Take 1 tablet (100 mg total) by mouth daily. 1 tablet 2 times per day x 10 days, then daily until return   estradiol 1 MG tablet Commonly known as: ESTRACE Take 1 mg by  mouth daily.   EUFLEXXA IX Inject 1 Dose into the articular space See admin instructions. Pt receives 1 dose every 6 to 12 months   fexofenadine 180 MG tablet Commonly known as: ALLEGRA Take 180 mg by mouth daily.   fluticasone 50 MCG/ACT nasal spray Commonly known as: FLONASE Place 2 sprays into both nostrils daily as needed for allergies or rhinitis.   ipratropium 0.06 % nasal spray Commonly known as: ATROVENT 2 SPRAYS EACH NOSTRIL EVERY 6 HOURS TO DRY NOSE   Lidoderm 5 % Generic drug: lidocaine 1 patch daily as needed (pain).   losartan-hydrochlorothiazide 50-12.5 MG tablet Commonly known as: HYZAAR Take 1 tablet by mouth daily.   loteprednol 0.5 % ophthalmic suspension Commonly known as: LOTEMAX Place 1 drop into both eyes daily in the afternoon.   Melatonin 1 MG Caps Take 0.5 mg by mouth at bedtime.   montelukast 10 MG tablet Commonly known as: SINGULAIR Take 10 mg by mouth at bedtime.   Mounjaro 7.5 MG/0.5ML Pen Generic drug: tirzepatide Inject 7.5 mg into the skin every Monday.   olopatadine 0.1 % ophthalmic solution Commonly known as: PATANOL 1 drop 2 (two) times daily.   omeprazole 40 MG capsule Commonly known as: PRILOSEC Take 40 mg by mouth daily.   pregabalin 50 MG capsule Commonly known as: LYRICA Take 50 mg by mouth 2 (two) times daily.   Ryaltris 324-40 MCG/ACT Susp Generic drug: Olopatadine-Mometasone 2 sprays each nostril 2 times per  day   tiZANidine 4 MG tablet Commonly known as: ZANAFLEX Take 2-4 mg by mouth daily as needed for muscle spasms.    Past Medical History:  Diagnosis Date   Arthritis    Diabetes mellitus without complication (HCC)    GERD (gastroesophageal reflux disease)    Hypertension    Pneumonia     Past Surgical History:  Procedure Laterality Date   ABDOMINAL HYSTERECTOMY     LAPAROSCOPIC GASTRIC SLEEVE RESECTION N/A 09/12/2021   Procedure: LAPAROSCOPIC GASTRIC SLEEVE RESECTION;  Surgeon: Rodman Pickle, MD;  Location: [facility];  Service: General;  Laterality: N/A;   left foot surgery      left knee surgery      right carpal tunnel release     right shoulder surgery      TUBAL LIGATION     UPPER GI ENDOSCOPY N/A 09/12/2021   Procedure: UPPER GI ENDOSCOPY;  Surgeon: Sheliah Hatch De Blanch, MD;  Location: [facility];  Service: General;  Laterality: N/A;    Review of systems negative except as noted in HPI / PMHx or noted below:  Review of Systems  Constitutional: Negative.   HENT: Negative.    Eyes: Negative.   Respiratory: Negative.    Cardiovascular: Negative.   Gastrointestinal: Negative.   Genitourinary: Negative.   Musculoskeletal: Negative.   Skin: Negative.   Neurological: Negative.   Endo/Heme/Allergies: Negative.   Psychiatric/Behavioral: Negative.       Objective:   Vitals:   09/04/23 0844  BP: 118/76  Pulse: 81  Resp: 16  SpO2: 99%          Physical Exam Constitutional:      Appearance: She is not diaphoretic.  HENT:     Head: Normocephalic.     Right Ear: Tympanic membrane, ear canal and external ear normal.     Left Ear: Tympanic membrane, ear canal and external ear normal.     Nose: Nose normal. No mucosal edema or rhinorrhea.     Mouth/Throat:     Pharynx: Uvula midline. No oropharyngeal exudate.  Eyes:     Conjunctiva/sclera: Conjunctivae normal.  Neck:     Thyroid: No thyromegaly.     Trachea: Trachea normal. No tracheal tenderness or tracheal deviation.  Cardiovascular:     Rate and Rhythm: Normal rate and regular rhythm.     Heart sounds: Normal heart sounds, S1 normal and S2 normal. No murmur heard. Pulmonary:     Effort: No respiratory distress.     Breath sounds: Normal breath sounds. No stridor. No wheezing or rales.  Lymphadenopathy:     Head:     Right side of head: No tonsillar adenopathy.     Left side of head: No tonsillar adenopathy.     Cervical: No cervical adenopathy.  Skin:    Findings: No erythema or rash.     Nails:  There is no clubbing.  Neurological:     Mental Status: She is alert.     Diagnostics: none  Assessment and Plan:   1. Perennial allergic rhinitis   2. Blepharoconjunctivitis of both eyes, unspecified blepharoconjunctivitis type   3. Inflammatory dermatosis   4. Dry eye syndrome of both eyes    1. Do not rub or touch eyes or nose. Sports glasses.  2. Do not use any oral antihistamines  3. Continue Doxycycline 100 mg - 1 tablet 1 time per day  4. Continue Desonide 0.05% ointment - apply to eyelids 3-7 times per week  5. Continue  Loteprednol  0.5%- 1 drop each eye 3-7 times per week  6. Continue Ryaltris - 2 sprays each nostril 2 times per day  7. Before bedtime use Systane GEL drop  8. Upon awakening, use Systane drops   9. If needed:   A. Ipratropium 0.06% - 2 sprays each nostril every 6 hours to dry nose  10. Return to clinic in 6 months or earlier if problem  11. Visit with eye doctor in January 2025  Michelle Perry is doing very well on her current plan of using anti-inflammatory agents for both her airway and I and she will continue on the plan noted above.  She has an appointment to see her ophthalmologist in January 2025 to have her eye pressure checked and I assume that visit will go well and if she continues to do well with the plan noted above then I will see her back in this clinic in 6 months or earlier if there is a problem.  Laurette Schimke, MD Allergy / Immunology [facility]

## 2023-09-04 NOTE — Patient Instructions (Addendum)
  1. Do not rub or touch eyes or nose. Sports glasses.  2. Do not use any oral antihistamines  3. Continue Doxycycline 100 mg - 1 tablet 1 time per day  4. Continue Desonide 0.05% ointment - apply to eyelids 3-7 times per week  5. Continue Loteprednol  0.5%- 1 drop each eye 3-7 times per week  6. Continue Ryaltris - 2 sprays each nostril 2 times per day  7. Before bedtime use Systane GEL drop  8. Upon awakening, use Systane drops   9. If needed:   A. Ipratropium 0.06% - 2 sprays each nostril every 6 hours to dry nose  10. Return to clinic in 6 months or earlier if problem  11. Visit with eye doctor in January 2025

## 2023-09-05 ENCOUNTER — Encounter: Payer: Self-pay | Admitting: Allergy and Immunology

## 2023-09-06 DIAGNOSIS — M533 Sacrococcygeal disorders, not elsewhere classified: Secondary | ICD-10-CM | POA: Diagnosis not present

## 2023-09-11 ENCOUNTER — Other Ambulatory Visit: Payer: Self-pay | Admitting: *Deleted

## 2023-09-11 MED ORDER — RYALTRIS 665-25 MCG/ACT NA SUSP: NASAL | 5 refills | Status: DC

## 2023-10-08 ENCOUNTER — Encounter: Payer: Self-pay | Admitting: Skilled Nursing Facility1

## 2023-10-08 ENCOUNTER — Encounter: Payer: BC Managed Care – PPO | Attending: General Surgery | Admitting: Skilled Nursing Facility1

## 2023-10-08 DIAGNOSIS — Z713 Dietary counseling and surveillance: Secondary | ICD-10-CM | POA: Diagnosis not present

## 2023-10-08 DIAGNOSIS — Z6826 Body mass index (BMI) 26.0-26.9, adult: Secondary | ICD-10-CM | POA: Diagnosis not present

## 2023-10-08 DIAGNOSIS — Z9884 Bariatric surgery status: Secondary | ICD-10-CM | POA: Insufficient documentation

## 2023-10-08 NOTE — Progress Notes (Signed)
 Follow-up visit:  Post-Operative Sleeve Surgery  Appt time: 4:04-4:35  Primary concerns today: Post-operative Bariatric Surgery Nutrition Management   Anthropometrics  Surgery date: 09/12/2021 Surgery type: sleeve Start weight at [facility]: 264 Weight today: 163.3 lbs   Body Composition Scale 02/13/2023 05/20/2023 10/07/2022  Weight  lbs 158.3 157 163.3  Total Body Fat  % 32 31.7 33.2     Visceral Fat 8 7 8   Fat-Free Mass  % 67.9 68.2 66.7     Total Body Water  % 48.4 48.6 47.8     Muscle-Mass  lbs 30.4 30.4 30.4  BMI 26 25.8 26.8  Body Fat Displacement           Torso  lbs 31.3 30.7 33.5        Left Leg  lbs 6.2 6.1 6.7        Right Leg  lbs 6.2 6.1 6.7        Left Arm  lbs 3.1 3.0 3.3        Right Arm  lbs 3.1 3.0 3.3   Clinical  Medical hx: DM, HTN, hypercholesterolemia  Medications: monjouro, bp med, cholesterol med Labs: A1C 6.5, iron saturation 14, b12 1935 (took multivitmain before lab was taken)  Notable signs/symptoms: knee pain, back pain Any previous deficiencies? No  Pt arrives with shiny healthy hair.  Pt states she is working really hard on changing her focus on feeling Strong not being skinny. Pt states she is hungry all the time stating she is hungry about every 3 hours.   24 hr recall: First meal 8am: cereal or cranberry nut muffin: half wheat flour + protein powder + protein shake Snack:  Second meal 11:30-12: meatloaf + cabbage + scallop potatoes  Snack 2: greek yogurt + cool whip + jello  + semi sweet chocolate chips  Snack: 5pm: fruit Third meal 8: meat + veggie + starch Snack: keto ice cream  Beverages: water, 1 Lipton diet tea (16 oz), protein water  Fluid intake: 64+ oz   Medications: See List Supplementation: multi and calcium   Using straws: no Drinking while eating: no Having you been chewing well: yes Chewing/swallowing difficulties: no Changes in vision: no Changes to mood/headaches: no Hair loss/Cahnges to skin/Changes to nails:  no Any difficulty focusing or concentrating: no Sweating: no Dizziness/Lightheaded: no Palpitations: no  Carbonated beverages: no N/V/D/C/GAS: having a bowel movement 2 days a week sometimes needing a medication metamucil Abdominal Pain: no Dumping syndrome: no  Recent physical activity: weight lifting, and cardio 2-3 times a week.   Progress Towards Goal(s):  In Progress Teaching method utilized: Visual & Auditory  Demonstrated degree of understanding via: Teach Back  Readiness Level: Action Barriers to learning/adherence to lifestyle change: none identified  Education Topics: Encouraged patient to honor their body's internal hunger and fullness cues.  Throughout the day, check in mentally and rate hunger. Stop eating when satisfied not full regardless of how much food is left on the plate.  Get more if still hungry 20-30 minutes later.  The key is to honor satisfaction so throughout the meal, rate fullness factor and stop when comfortably satisfied not physically full.  Encouraged pt to recognize feeling hungry is a positive thing not negative and it means she has been fueling her body appropriately throughout the day  Encouraged pt to think of the wight gain she experienced as health gain  Goals:  -I will get back to your cardio of 30 minutes at a time 3-4 days a  week -great job on listening to your hunger cues and eating every 3 hours    Teaching Method Utilized:  Visual Auditory Hands on  Demonstrated degree of understanding via:  Teach Back   Monitoring/Evaluation:  Dietary intake, exercise, and body weight. Follow-up in 3 months

## 2023-10-14 DIAGNOSIS — H04213 Epiphora due to excess lacrimation, bilateral lacrimal glands: Secondary | ICD-10-CM | POA: Diagnosis not present

## 2023-10-14 DIAGNOSIS — H04123 Dry eye syndrome of bilateral lacrimal glands: Secondary | ICD-10-CM | POA: Diagnosis not present

## 2023-10-14 DIAGNOSIS — H0279 Other degenerative disorders of eyelid and periocular area: Secondary | ICD-10-CM | POA: Diagnosis not present

## 2023-10-14 DIAGNOSIS — H04222 Epiphora due to insufficient drainage, left lacrimal gland: Secondary | ICD-10-CM | POA: Diagnosis not present

## 2023-10-14 DIAGNOSIS — H04563 Stenosis of bilateral lacrimal punctum: Secondary | ICD-10-CM | POA: Diagnosis not present

## 2023-10-14 DIAGNOSIS — H04223 Epiphora due to insufficient drainage, bilateral lacrimal glands: Secondary | ICD-10-CM | POA: Diagnosis not present

## 2023-10-22 DIAGNOSIS — M47816 Spondylosis without myelopathy or radiculopathy, lumbar region: Secondary | ICD-10-CM | POA: Diagnosis not present

## 2023-11-06 DIAGNOSIS — H04563 Stenosis of bilateral lacrimal punctum: Secondary | ICD-10-CM | POA: Diagnosis not present

## 2023-11-10 ENCOUNTER — Other Ambulatory Visit: Payer: Self-pay | Admitting: Allergy and Immunology

## 2023-11-14 DIAGNOSIS — M47816 Spondylosis without myelopathy or radiculopathy, lumbar region: Secondary | ICD-10-CM | POA: Diagnosis not present

## 2023-11-22 DIAGNOSIS — I10 Essential (primary) hypertension: Secondary | ICD-10-CM | POA: Diagnosis not present

## 2023-11-22 DIAGNOSIS — Z0000 Encounter for general adult medical examination without abnormal findings: Secondary | ICD-10-CM | POA: Diagnosis not present

## 2023-11-22 DIAGNOSIS — E785 Hyperlipidemia, unspecified: Secondary | ICD-10-CM | POA: Diagnosis not present

## 2023-11-22 DIAGNOSIS — R6889 Other general symptoms and signs: Secondary | ICD-10-CM | POA: Diagnosis not present

## 2023-11-22 DIAGNOSIS — E1169 Type 2 diabetes mellitus with other specified complication: Secondary | ICD-10-CM | POA: Diagnosis not present

## 2023-11-27 DIAGNOSIS — H04123 Dry eye syndrome of bilateral lacrimal glands: Secondary | ICD-10-CM | POA: Diagnosis not present

## 2023-11-27 DIAGNOSIS — H04213 Epiphora due to excess lacrimation, bilateral lacrimal glands: Secondary | ICD-10-CM | POA: Diagnosis not present

## 2023-12-08 ENCOUNTER — Other Ambulatory Visit: Payer: Self-pay | Admitting: Allergy and Immunology

## 2023-12-13 DIAGNOSIS — Z1231 Encounter for screening mammogram for malignant neoplasm of breast: Secondary | ICD-10-CM | POA: Diagnosis not present

## 2023-12-24 DIAGNOSIS — M47816 Spondylosis without myelopathy or radiculopathy, lumbar region: Secondary | ICD-10-CM | POA: Diagnosis not present

## 2023-12-27 DIAGNOSIS — H0279 Other degenerative disorders of eyelid and periocular area: Secondary | ICD-10-CM | POA: Diagnosis not present

## 2023-12-27 DIAGNOSIS — H16223 Keratoconjunctivitis sicca, not specified as Sjogren's, bilateral: Secondary | ICD-10-CM | POA: Diagnosis not present

## 2023-12-27 DIAGNOSIS — H0288B Meibomian gland dysfunction left eye, upper and lower eyelids: Secondary | ICD-10-CM | POA: Diagnosis not present

## 2023-12-27 DIAGNOSIS — H0288A Meibomian gland dysfunction right eye, upper and lower eyelids: Secondary | ICD-10-CM | POA: Diagnosis not present

## 2023-12-27 DIAGNOSIS — H0102B Squamous blepharitis left eye, upper and lower eyelids: Secondary | ICD-10-CM | POA: Diagnosis not present

## 2023-12-30 ENCOUNTER — Ambulatory Visit: Admitting: Allergy and Immunology

## 2023-12-30 ENCOUNTER — Encounter: Payer: Self-pay | Admitting: Allergy and Immunology

## 2023-12-30 VITALS — BP 118/78 | HR 78 | Resp 16 | Ht 65.0 in | Wt 178.4 lb

## 2023-12-30 DIAGNOSIS — H10503 Unspecified blepharoconjunctivitis, bilateral: Secondary | ICD-10-CM

## 2023-12-30 DIAGNOSIS — H04123 Dry eye syndrome of bilateral lacrimal glands: Secondary | ICD-10-CM | POA: Diagnosis not present

## 2023-12-30 DIAGNOSIS — J3089 Other allergic rhinitis: Secondary | ICD-10-CM | POA: Diagnosis not present

## 2023-12-30 DIAGNOSIS — L989 Disorder of the skin and subcutaneous tissue, unspecified: Secondary | ICD-10-CM

## 2023-12-30 MED ORDER — METHYLPREDNISOLONE ACETATE 80 MG/ML IJ SUSP
80.0000 mg | Freq: Once | INTRAMUSCULAR | Status: AC
  Administered 2023-12-30: 80 mg via INTRAMUSCULAR

## 2023-12-30 NOTE — Progress Notes (Unsigned)
 [place] - [place] - [place] - [place] - [place]   Follow-up Note  Referring Provider: Ailene Ravel, MD Primary Provider: Ailene Ravel, MD Date of Office Visit: 12/30/2023  Subjective:   Michelle Perry (DOB: [birth year]) is a 58 y.o. female who [facility] in re-evaluation of the following:  HPI: Michelle Perry returns to this clinic in evaluation of allergic rhinoconjunctivitis and rosacea blepharoconjunctivitis.  I last saw him in this clinic for December 2024.  She has really done well with her eyes on her current plan of treating both inflammation and rosacea up until about 3 days ago.  Since that point in time she has had lots of itchiness of her eyes without any discharge and without any pain.  The skin around her eyes is still doing very well.  She has been consistently using a steroid eyedrop and has been using some topical desonide around her eye and continues on doxycycline.  She has had very little problems with her nose and she is using her combination nasal steroid/nasal antihistamine.  She did take some Zyrtec recently but she is not sure it helped and may have actually irritated her eye.  Allergies as of 12/30/2023       Reactions   Percocet [oxycodone-acetaminophen]    vertigo        Medication List    atorvastatin 10 MG tablet Commonly known as: LIPITOR Take 10 mg by mouth at bedtime.   celecoxib 200 MG capsule Commonly known as: CELEBREX Take 200 mg by mouth daily.   cholecalciferol 25 MCG (1000 UNIT) tablet Commonly known as: VITAMIN D3 Take 1,000 Units by mouth daily.   desonide 0.05 % ointment Commonly known as: DESOWEN Water followed by ointment 1 time per day   doxycycline 100 MG capsule Commonly known as: VIBRAMYCIN Take 1 capsule (100 mg total) by mouth daily.   doxycycline 100 MG tablet Commonly known as: VIBRA-TABS TAKE 1 TABLET BY MOUTH DAILY. 1 TABLET 2 TIMES PER DAY X 10 DAYS, THEN  DAILY UNTIL RETURN   estradiol 1 MG tablet Commonly known as: ESTRACE Take 1 mg by mouth daily.   EUFLEXXA IX Inject 1 Dose into the articular space See admin instructions. Pt receives 1 dose every 6 to 12 months   fluticasone 50 MCG/ACT nasal spray Commonly known as: FLONASE Place 2 sprays into both nostrils daily as needed for allergies or rhinitis.   ipratropium 0.06 % nasal spray Commonly known as: ATROVENT 2 SPRAYS EACH NOSTRIL EVERY 6 HOURS TO DRY NOSE   Lidoderm 5 % Generic drug: lidocaine 1 patch daily as needed (pain).   losartan-hydrochlorothiazide 50-12.5 MG tablet Commonly known as: HYZAAR Take 1 tablet by mouth daily.   loteprednol 0.5 % ophthalmic suspension Commonly known as: LOTEMAX Place 1 drop into both eyes daily in the afternoon.   Melatonin 1 MG Caps Take 0.5 mg by mouth at bedtime.   Mounjaro 7.5 MG/0.5ML Pen Generic drug: tirzepatide Inject 7.5 mg into the skin every Monday.   omeprazole 40 MG capsule Commonly known as: PRILOSEC Take 40 mg by mouth daily.   pregabalin 50 MG capsule Commonly known as: LYRICA Take 50 mg by mouth 2 (two) times daily.   Ryaltris 161-09 MCG/ACT Susp Generic drug: Olopatadine-Mometasone 2 sprays each nostril 2 times per day   tiZANidine 4 MG tablet Commonly known as: ZANAFLEX Take 2-4 mg by mouth daily as needed for muscle spasms.    Past  Medical History:  Diagnosis Date   Arthritis    Diabetes mellitus without complication (HCC)    GERD (gastroesophageal reflux disease)    Hypertension    Pneumonia     Past Surgical History:  Procedure Laterality Date   ABDOMINAL HYSTERECTOMY     LAPAROSCOPIC GASTRIC SLEEVE RESECTION N/A 09/12/2021   Procedure: LAPAROSCOPIC GASTRIC SLEEVE RESECTION;  Surgeon: Rodman Pickle, MD;  Location: [facility];  Service: General;  Laterality: N/A;   left foot surgery      left knee surgery      right carpal tunnel release     right shoulder surgery      TUBAL LIGATION      UPPER GI ENDOSCOPY N/A 09/12/2021   Procedure: UPPER GI ENDOSCOPY;  Surgeon: Sheliah Hatch De Blanch, MD;  Location: [facility];  Service: General;  Laterality: N/A;    Review of systems negative except as noted in HPI / PMHx or noted below:  Review of Systems  Constitutional: Negative.   HENT: Negative.    Eyes: Negative.   Respiratory: Negative.    Cardiovascular: Negative.   Gastrointestinal: Negative.   Genitourinary: Negative.   Musculoskeletal: Negative.   Skin: Negative.   Neurological: Negative.   Endo/Heme/Allergies: Negative.   Psychiatric/Behavioral: Negative.       Objective:   Vitals:   12/30/23 1519  BP: 118/78  Pulse: 78  Resp: 16  SpO2: 98%   Height: 5\' 5"  (165.1 cm)  Weight: 178 lb 6.4 oz (80.9 kg)   Physical Exam Constitutional:      Appearance: She is not diaphoretic.  HENT:     Head: Normocephalic.     Right Ear: Tympanic membrane, ear canal and external ear normal.     Left Ear: Tympanic membrane, ear canal and external ear normal.     Nose: Nose normal. No mucosal edema or rhinorrhea.     Mouth/Throat:     Pharynx: Uvula midline. No oropharyngeal exudate.  Eyes:     Conjunctiva/sclera: Conjunctivae normal.  Neck:     Thyroid: No thyromegaly.     Trachea: Trachea normal. No tracheal tenderness or tracheal deviation.  Cardiovascular:     Rate and Rhythm: Normal rate and regular rhythm.     Heart sounds: Normal heart sounds, S1 normal and S2 normal. No murmur heard. Pulmonary:     Effort: No respiratory distress.     Breath sounds: Normal breath sounds. No stridor. No wheezing or rales.  Lymphadenopathy:     Head:     Right side of head: No tonsillar adenopathy.     Left side of head: No tonsillar adenopathy.     Cervical: No cervical adenopathy.  Skin:    Findings: No erythema or rash.     Nails: There is no clubbing.  Neurological:     Mental Status: She is alert.     Diagnostics: none  Assessment and Plan:   1. Perennial  allergic rhinitis   2. Blepharoconjunctivitis of both eyes, unspecified blepharoconjunctivitis type   3. Inflammatory dermatosis   4. Dry eye syndrome of both eyes    1. Do not rub or touch eyes or nose. Sports glasses.  2. Do not use any oral antihistamines  3. Continue:  A. Doxycycline 100 mg - 1 tablet 1 time per day B. Desonide 0.05% ointment - apply to eyelids 3-7 times per week C. Loteprednol  0.5%- 1 drop each eye 3-7 times per weeK D. Ryaltris - 2 sprays each nostril 1-2 times  per day  4. Before bedtime use Systane GEL drop  5. Upon awakening, use Systane drops   6. If needed:   A. Ipratropium 0.06% - 2 sprays each nostril every 6 hours to dry nose  7. For this recent event:   A. Depomedrol 80 mg IM delivered in clinic today  B. Further treatment???  8. Return to clinic in 6 months or earlier if problem  9. Influenza = Tamiflu. Covid = Paxlovid  Michelle Perry will utilize a systemic steroid to help decrease any inflammation of her eye while she continues on a plan directed against inflammation and rosacea which has worked just great until the past 3 weeks.  Hopefully we can get her through these next few months with the treatment noted above and everything will revert back to her very good response to medical treatment.   Laurette Schimke, MD Allergy / Immunology [facility]

## 2023-12-30 NOTE — Patient Instructions (Addendum)
  1. Do not rub or touch eyes or nose. Sports glasses.  2. Do not use any oral antihistamines  3. Continue:  A. Doxycycline 100 mg - 1 tablet 1 time per day B. Desonide 0.05% ointment - apply to eyelids 3-7 times per week C. Loteprednol  0.5%- 1 drop each eye 3-7 times per weeK D. Ryaltris - 2 sprays each nostril 1-2 times per day  4. Before bedtime use Systane GEL drop  5. Upon awakening, use Systane drops   6. If needed:   A. Ipratropium 0.06% - 2 sprays each nostril every 6 hours to dry nose  7. For this recent event:   A. Depomedrol 80 mg IM delivered in clinic today  B. Further treatment???  8. Return to clinic in 6 months or earlier if problem  9. Influenza = Tamiflu. Covid = Paxlovid

## 2023-12-31 ENCOUNTER — Encounter: Payer: Self-pay | Admitting: Allergy and Immunology

## 2024-01-06 ENCOUNTER — Encounter: Payer: Self-pay | Admitting: Skilled Nursing Facility1

## 2024-01-06 ENCOUNTER — Encounter: Attending: General Surgery | Admitting: Skilled Nursing Facility1

## 2024-01-06 ENCOUNTER — Ambulatory Visit: Payer: BC Managed Care – PPO | Admitting: Skilled Nursing Facility1

## 2024-01-06 DIAGNOSIS — E669 Obesity, unspecified: Secondary | ICD-10-CM | POA: Insufficient documentation

## 2024-01-06 NOTE — Progress Notes (Signed)
 Follow-up visit:  Post-Operative Sleeve Surgery  Appt time: 4:04-4:35  Primary concerns today: Post-operative Bariatric Surgery Nutrition Management   Anthropometrics  Surgery date: 09/12/2021 Surgery type: sleeve Start weight at [facility]: 264 Weight today: 171.1 lbs   Body Composition Scale 05/20/2023 10/07/2022 01/06/2024  Weight  lbs 157 163.3 171.1  Total Body Fat  % 31.7 33.2 34.9     Visceral Fat 7 8 9   Fat-Free Mass  % 68.2 66.7 65     Total Body Water  % 48.6 47.8 47     Muscle-Mass  lbs 30.4 30.4 30.4  BMI 25.8 26.8 28.1  Body Fat Displacement           Torso  lbs 30.7 33.5 36.9        Left Leg  lbs 6.1 6.7 7.3        Right Leg  lbs 6.1 6.7 7.3        Left Arm  lbs 3.0 3.3 3.6        Right Arm  lbs 3.0 3.3 3.6   Clinical  Medical hx: DM, HTN, hypercholesterolemia  Medications: monjouro, bp med, cholesterol med Labs: A1C 6.5, iron saturation 14, b12 1935 (took multivitmain before lab was taken)  Notable signs/symptoms: knee pain, back pain Any previous deficiencies? No  Pt arrives with shiny healthy hair.  Pt states she is working really hard on changing her focus on feeling Strong not being skinny.   Pt states she no longer needs any medicine to help her have a bowel movement. Pt states she is really worried she is gaining too much weight. Dietitian advised pt now that she is filling out healthily she can now rely on her hunger cues to dictate if she needs a snack or not and no longer HAS to have 2-3 snacks per day.   24 hr recall: First meal 8am: cheerios veggie blend cereal + 2% milk + protein shake Snack: fruit or yogurt + granola + fruit or chic peas + pistacios  Second meal 11:30-12: meatloaf + cabbage or  scallop potatoes  Snack:  fruit or yogurt + granola + fruit or chic peas + pistacios  Third meal 8: steak + potato + butter + cheese Snack: keto ice cream  Beverages: water, 1 Lipton diet tea (16 oz), electrolyte water  Fluid intake: 64+ oz    Medications: See List Supplementation: multi and calcium   Using straws: no Drinking while eating: no Having you been chewing well: yes Chewing/swallowing difficulties: no Changes in vision: no Changes to mood/headaches: no Hair loss/Cahnges to skin/Changes to nails: no Any difficulty focusing or concentrating: no Sweating: no Dizziness/Lightheaded: no Palpitations: no  Carbonated beverages: no N/V/D/C/GAS: no issues Abdominal Pain: no Dumping syndrome: no  Recent physical activity: yoga at home 1-2 days a week, gym 3 days a week strength and cardio  Progress Towards Goal(s):  In Progress Teaching method utilized: Visual & Auditory  Demonstrated degree of understanding via: Teach Back  Readiness Level: Action Barriers to learning/adherence to lifestyle change: none identified  Education Topics: continued  Encouraged patient to honor their body's internal hunger and fullness cues.  Throughout the day, check in mentally and rate hunger. Stop eating when satisfied not full regardless of how much food is left on the plate.  Get more if still hungry 20-30 minutes later.  The key is to honor satisfaction so throughout the meal, rate fullness factor and stop when comfortably satisfied not physically full.  Encouraged pt to recognize  feeling hungry is a positive thing not negative and it means she has been fueling her body appropriately throughout the day  Encouraged pt to think of the wight gain she experienced as health gain  Goals:  -Only eat snacks if your body is hungry for them -Be sure to include non starchy vegetables with lunch and dinner daily   Teaching Method Utilized:  Visual Auditory Hands on  Demonstrated degree of understanding via:  Teach Back   Monitoring/Evaluation:  Dietary intake, exercise, and body weight. Follow-up in 2-3 months

## 2024-01-08 ENCOUNTER — Ambulatory Visit: Admitting: Skilled Nursing Facility1

## 2024-01-21 DIAGNOSIS — D225 Melanocytic nevi of trunk: Secondary | ICD-10-CM | POA: Diagnosis not present

## 2024-01-21 DIAGNOSIS — L821 Other seborrheic keratosis: Secondary | ICD-10-CM | POA: Diagnosis not present

## 2024-01-21 DIAGNOSIS — L814 Other melanin hyperpigmentation: Secondary | ICD-10-CM | POA: Diagnosis not present

## 2024-01-21 DIAGNOSIS — L578 Other skin changes due to chronic exposure to nonionizing radiation: Secondary | ICD-10-CM | POA: Diagnosis not present

## 2024-01-24 DIAGNOSIS — H04213 Epiphora due to excess lacrimation, bilateral lacrimal glands: Secondary | ICD-10-CM | POA: Diagnosis not present

## 2024-01-24 DIAGNOSIS — H16223 Keratoconjunctivitis sicca, not specified as Sjogren's, bilateral: Secondary | ICD-10-CM | POA: Diagnosis not present

## 2024-01-24 DIAGNOSIS — H0102B Squamous blepharitis left eye, upper and lower eyelids: Secondary | ICD-10-CM | POA: Diagnosis not present

## 2024-01-24 DIAGNOSIS — H0102A Squamous blepharitis right eye, upper and lower eyelids: Secondary | ICD-10-CM | POA: Diagnosis not present

## 2024-01-24 DIAGNOSIS — H0288A Meibomian gland dysfunction right eye, upper and lower eyelids: Secondary | ICD-10-CM | POA: Diagnosis not present

## 2024-01-24 DIAGNOSIS — H0288B Meibomian gland dysfunction left eye, upper and lower eyelids: Secondary | ICD-10-CM | POA: Diagnosis not present

## 2024-01-24 DIAGNOSIS — H0279 Other degenerative disorders of eyelid and periocular area: Secondary | ICD-10-CM | POA: Diagnosis not present

## 2024-02-11 DIAGNOSIS — H04223 Epiphora due to insufficient drainage, bilateral lacrimal glands: Secondary | ICD-10-CM | POA: Diagnosis not present

## 2024-02-11 DIAGNOSIS — H04123 Dry eye syndrome of bilateral lacrimal glands: Secondary | ICD-10-CM | POA: Diagnosis not present

## 2024-02-11 DIAGNOSIS — H18592 Other hereditary corneal dystrophies, left eye: Secondary | ICD-10-CM | POA: Diagnosis not present

## 2024-02-11 DIAGNOSIS — H1045 Other chronic allergic conjunctivitis: Secondary | ICD-10-CM | POA: Diagnosis not present

## 2024-02-11 DIAGNOSIS — H35033 Hypertensive retinopathy, bilateral: Secondary | ICD-10-CM | POA: Diagnosis not present

## 2024-02-19 DIAGNOSIS — M533 Sacrococcygeal disorders, not elsewhere classified: Secondary | ICD-10-CM | POA: Diagnosis not present

## 2024-02-19 DIAGNOSIS — M47816 Spondylosis without myelopathy or radiculopathy, lumbar region: Secondary | ICD-10-CM | POA: Diagnosis not present

## 2024-02-26 DIAGNOSIS — H04551 Acquired stenosis of right nasolacrimal duct: Secondary | ICD-10-CM | POA: Diagnosis not present

## 2024-02-26 DIAGNOSIS — H0279 Other degenerative disorders of eyelid and periocular area: Secondary | ICD-10-CM | POA: Diagnosis not present

## 2024-02-26 DIAGNOSIS — H04221 Epiphora due to insufficient drainage, right lacrimal gland: Secondary | ICD-10-CM | POA: Diagnosis not present

## 2024-02-26 DIAGNOSIS — H04123 Dry eye syndrome of bilateral lacrimal glands: Secondary | ICD-10-CM | POA: Diagnosis not present

## 2024-02-26 DIAGNOSIS — H04213 Epiphora due to excess lacrimation, bilateral lacrimal glands: Secondary | ICD-10-CM | POA: Diagnosis not present

## 2024-02-27 DIAGNOSIS — M545 Low back pain, unspecified: Secondary | ICD-10-CM | POA: Diagnosis not present

## 2024-02-27 DIAGNOSIS — M25559 Pain in unspecified hip: Secondary | ICD-10-CM | POA: Diagnosis not present

## 2024-03-04 ENCOUNTER — Ambulatory Visit: Payer: BC Managed Care – PPO | Admitting: Allergy and Immunology

## 2024-03-17 DIAGNOSIS — M545 Low back pain, unspecified: Secondary | ICD-10-CM | POA: Diagnosis not present

## 2024-03-27 DIAGNOSIS — H0288A Meibomian gland dysfunction right eye, upper and lower eyelids: Secondary | ICD-10-CM | POA: Diagnosis not present

## 2024-03-27 DIAGNOSIS — H0288B Meibomian gland dysfunction left eye, upper and lower eyelids: Secondary | ICD-10-CM | POA: Diagnosis not present

## 2024-03-27 DIAGNOSIS — H04213 Epiphora due to excess lacrimation, bilateral lacrimal glands: Secondary | ICD-10-CM | POA: Diagnosis not present

## 2024-03-27 DIAGNOSIS — H0279 Other degenerative disorders of eyelid and periocular area: Secondary | ICD-10-CM | POA: Diagnosis not present

## 2024-03-27 DIAGNOSIS — H16223 Keratoconjunctivitis sicca, not specified as Sjogren's, bilateral: Secondary | ICD-10-CM | POA: Diagnosis not present

## 2024-04-02 DIAGNOSIS — M47816 Spondylosis without myelopathy or radiculopathy, lumbar region: Secondary | ICD-10-CM | POA: Diagnosis not present

## 2024-04-06 ENCOUNTER — Ambulatory Visit: Admitting: Skilled Nursing Facility1

## 2024-04-17 ENCOUNTER — Encounter (HOSPITAL_COMMUNITY): Payer: Self-pay | Admitting: *Deleted

## 2024-04-27 DIAGNOSIS — M47816 Spondylosis without myelopathy or radiculopathy, lumbar region: Secondary | ICD-10-CM | POA: Diagnosis not present

## 2024-05-08 ENCOUNTER — Other Ambulatory Visit: Payer: Self-pay | Admitting: Allergy and Immunology

## 2024-05-11 DIAGNOSIS — E782 Mixed hyperlipidemia: Secondary | ICD-10-CM | POA: Diagnosis not present

## 2024-05-11 DIAGNOSIS — E1121 Type 2 diabetes mellitus with diabetic nephropathy: Secondary | ICD-10-CM | POA: Diagnosis not present

## 2024-05-11 DIAGNOSIS — N951 Menopausal and female climacteric states: Secondary | ICD-10-CM | POA: Diagnosis not present

## 2024-05-11 DIAGNOSIS — I10 Essential (primary) hypertension: Secondary | ICD-10-CM | POA: Diagnosis not present

## 2024-05-13 ENCOUNTER — Other Ambulatory Visit: Payer: Self-pay | Admitting: Allergy and Immunology

## 2024-05-14 ENCOUNTER — Other Ambulatory Visit: Payer: Self-pay | Admitting: Allergy and Immunology

## 2024-05-14 DIAGNOSIS — M47816 Spondylosis without myelopathy or radiculopathy, lumbar region: Secondary | ICD-10-CM | POA: Diagnosis not present

## 2024-06-29 ENCOUNTER — Ambulatory Visit: Admitting: Allergy and Immunology

## 2024-06-29 ENCOUNTER — Encounter: Payer: Self-pay | Admitting: Allergy and Immunology

## 2024-06-29 VITALS — BP 94/72 | HR 76 | Resp 16 | Ht 64.0 in | Wt 177.4 lb

## 2024-06-29 DIAGNOSIS — H10503 Unspecified blepharoconjunctivitis, bilateral: Secondary | ICD-10-CM

## 2024-06-29 DIAGNOSIS — H04123 Dry eye syndrome of bilateral lacrimal glands: Secondary | ICD-10-CM | POA: Diagnosis not present

## 2024-06-29 DIAGNOSIS — J3089 Other allergic rhinitis: Secondary | ICD-10-CM | POA: Diagnosis not present

## 2024-06-29 DIAGNOSIS — L989 Disorder of the skin and subcutaneous tissue, unspecified: Secondary | ICD-10-CM

## 2024-06-29 MED ORDER — LOTEPREDNOL ETABONATE 0.5 % OP SUSP: OPHTHALMIC | 3 refills | Status: AC

## 2024-06-29 NOTE — Patient Instructions (Addendum)
  1. Do not rub or touch eyes or nose. Sports glasses.  2. Do not use any oral antihistamines  3. Continue:  A. Doxycycline  100 mg - 1 tablet 1 time per day B. Desonide  0.05% ointment - apply to eyelids 3-7 times per week C. Loteprednol   0.5%- 1 drop each eye 3-7 times per week D. Xiidra - 1 drop each eye 2 times per day F. Ryaltris  - 1-2 sprays each nostril 1-2 times per day G. Ipratropium 0.06% - 1-2 sprays each nostril 1-2 times per day  4. Return to clinic in 12 months or earlier if problem  5. Influenza = Tamiflu. Covid = Paxlovid

## 2024-06-29 NOTE — Progress Notes (Unsigned)
 [place] - [place] - [place] - [place] - [place]   Follow-up Note  Referring Provider: Stephanie Charlene CROME, MD Primary Provider: Stephanie Charlene CROME, MD Date of Office Visit: 06/29/2024  Subjective:   Michelle Perry (DOB: 1966/02/14) is a 58 y.o. female who [facility]24 in re-evaluation of the following:  HPI: Michelle Perry returns to this clinic in evaluation of allergic rhinoconjunctivitis, rosacea blepharoconjunctivitis, dry eye.  I last saw her in this clinic 30 December 2023.  She is visit with an ophthalmologist in [place] and she is now on Xiidra and there is a plan to perform a stent placement in her eye in October and December of this upcoming year.  Overall she thinks that she has done very well.  She uses a collection of medications directed against inflammation of her conjunctiva and around her conjunctiva including doxycycline , desonide  ointment, and she has tapered off for loteprednol  eyedrops.  She feels as though her eyes are better although there certainly they are still little irritated.  And she has had very little problems with her nose while using a combination of Ryaltris  and ipratropium twice a day and she is very satisfied with the response that she has received without formal treatment.  Allergies as of 06/29/2024       Reactions   Percocet [oxycodone-acetaminophen ]    vertigo        Medication List    atorvastatin 10 MG tablet Commonly known as: LIPITOR Take 10 mg by mouth at bedtime.   celecoxib 200 MG capsule Commonly known as: CELEBREX Take 200 mg by mouth daily.   cholecalciferol 25 MCG (1000 UNIT) tablet Commonly known as: VITAMIN D3 Take 1,000 Units by mouth daily.   desonide  0.05 % ointment Commonly known as: DESOWEN  Water followed by ointment 1 time per day   diclofenac Sodium 1 % Gel Commonly known as: VOLTAREN SMARTSIG:2 Gram(s) Topical 3 Times Daily PRN   doxycycline  100 MG  tablet Commonly known as: VIBRA -TABS TAKE 1 TABLET BY MOUTH DAILY. 1 TABLET 2 TIMES PER DAY X 10 DAYS, THEN DAILY UNTIL RETURN   estradiol 1 MG tablet Commonly known as: ESTRACE Take 1 mg by mouth daily.   EUFLEXXA IX Inject 1 Dose into the articular space See admin instructions. Pt receives 1 dose every 6 to 12 months   ipratropium 0.06 % nasal spray Commonly known as: ATROVENT  2 SPRAYS EACH NOSTRIL EVERY 6 HOURS TO DRY NOSE   Jardiance 10 MG Tabs tablet Generic drug: empagliflozin Take 10 mg by mouth daily.   Lidoderm  5 % Generic drug: lidocaine  1 patch daily as needed (pain).   losartan-hydrochlorothiazide 50-12.5 MG tablet Commonly known as: HYZAAR Take 1 tablet by mouth daily.   loteprednol  0.5 % ophthalmic suspension Commonly known as: LOTEMAX  Can use one drop in each eye three to seven times per week as directed.   Melatonin 1 MG Caps Take 0.5 mg by mouth at bedtime.   Mounjaro 5 MG/0.5ML Pen Generic drug: tirzepatide SMARTSIG:5 Milligram(s) SUB-Q Once a Week   omeprazole 40 MG capsule Commonly known as: PRILOSEC Take 40 mg by mouth daily.   pregabalin 50 MG capsule Commonly known as: LYRICA Take 50 mg by mouth 2 (two) times daily.   Ryaltris  665-25 MCG/ACT Susp Generic drug: Olopatadine-Mometasone INSTILL 2 SPRAYS IN EACH NOSTRIL TWICE A DAY   tiZANidine 4 MG tablet Commonly known as: ZANAFLEX Take 2-4 mg by mouth daily as needed for muscle spasms.  Vevye 0.1 % Soln Generic drug: cycloSPORINE   Xiidra 5 % Soln Generic drug: Lifitegrast INSTILL 1 DROP INTO BOTH EYES BY OPHTHALMIC ROUTE 2 TIMES PER DAY APPROXIMATELY 12 HOURS APART    Past Medical History:  Diagnosis Date   Arthritis    Diabetes mellitus without complication (HCC)    GERD (gastroesophageal reflux disease)    Hypertension    Pneumonia     Past Surgical History:  Procedure Laterality Date   ABDOMINAL HYSTERECTOMY     LAPAROSCOPIC GASTRIC SLEEVE RESECTION N/A 09/12/2021    Procedure: LAPAROSCOPIC GASTRIC SLEEVE RESECTION;  Surgeon: Stevie Herlene Righter, MD;  Location: [facility];  Service: General;  Laterality: N/A;   left foot surgery      left knee surgery      right carpal tunnel release     right shoulder surgery      TUBAL LIGATION     UPPER GI ENDOSCOPY N/A 09/12/2021   Procedure: UPPER GI ENDOSCOPY;  Surgeon: Stevie, Herlene Righter, MD;  Location: [facility];  Service: General;  Laterality: N/A;    Review of systems negative except as noted in HPI / PMHx or noted below:  Review of Systems  Constitutional: Negative.   HENT: Negative.    Eyes: Negative.   Respiratory: Negative.    Cardiovascular: Negative.   Gastrointestinal: Negative.   Genitourinary: Negative.   Musculoskeletal: Negative.   Skin: Negative.   Neurological: Negative.   Endo/Heme/Allergies: Negative.   Psychiatric/Behavioral: Negative.       Objective:   Vitals:   06/29/24 1528  BP: 94/72  Pulse: 76  Resp: 16  SpO2: 98%   Height: 5' 4 (162.6 cm)  Weight: 177 lb 6.4 oz (80.5 kg)   Physical Exam Constitutional:      Appearance: She is not diaphoretic.  HENT:     Head: Normocephalic.     Right Ear: Tympanic membrane, ear canal and external ear normal.     Left Ear: Tympanic membrane, ear canal and external ear normal.     Nose: Nose normal. No mucosal edema or rhinorrhea.     Mouth/Throat:     Pharynx: Uvula midline. No oropharyngeal exudate.  Eyes:     Conjunctiva/sclera: Conjunctivae normal.  Neck:     Thyroid : No thyromegaly.     Trachea: Trachea normal. No tracheal tenderness or tracheal deviation.  Cardiovascular:     Rate and Rhythm: Normal rate and regular rhythm.     Heart sounds: Normal heart sounds, S1 normal and S2 normal. No murmur heard. Pulmonary:     Effort: No respiratory distress.     Breath sounds: Normal breath sounds. No stridor. No wheezing or rales.  Lymphadenopathy:     Head:     Right side of head: No tonsillar adenopathy.     Left side  of head: No tonsillar adenopathy.     Cervical: No cervical adenopathy.  Skin:    Findings: No erythema or rash.     Nails: There is no clubbing.  Neurological:     Mental Status: She is alert.     Diagnostics: none  Assessment and Plan:   1. Perennial allergic rhinitis   2. Blepharoconjunctivitis of both eyes, unspecified blepharoconjunctivitis type   3. Inflammatory dermatosis   4. Dry eye syndrome of both eyes     1. Do not rub or touch eyes or nose. Sports glasses.  2. Do not use any oral antihistamines  3. Continue:  A. Doxycycline  100 mg - 1  tablet 1 time per day B. Desonide  0.05% ointment - apply to eyelids 3-7 times per week C. Loteprednol   0.5%- 1 drop each eye 3-7 times per week D. Xiidra - 1 drop each eye 2 times per day F. Ryaltris  - 1-2 sprays each nostril 1-2 times per day G. Ipratropium 0.06% - 1-2 sprays each nostril 1-2 times per day  4. Return to clinic in 12 months or earlier if problem  5. Influenza = Tamiflu. Covid = Paxlovid  Michelle Perry appears to be doing pretty well on her current treatment.  I will see her back in this clinic in 1 year or earlier if there is a problem.  Camellia Denis, MD Allergy  / Immunology [facility]

## 2024-06-30 ENCOUNTER — Encounter: Payer: Self-pay | Admitting: Allergy and Immunology

## 2024-07-13 ENCOUNTER — Other Ambulatory Visit: Payer: Self-pay | Admitting: Ophthalmology

## 2024-07-13 DIAGNOSIS — H04551 Acquired stenosis of right nasolacrimal duct: Secondary | ICD-10-CM | POA: Diagnosis not present

## 2024-07-13 DIAGNOSIS — H04411 Chronic dacryocystitis of right lacrimal passage: Secondary | ICD-10-CM | POA: Diagnosis not present

## 2024-07-13 DIAGNOSIS — H04021 Chronic dacryoadenitis, right lacrimal gland: Secondary | ICD-10-CM | POA: Diagnosis not present

## 2024-07-13 DIAGNOSIS — J3481 Nasal mucositis (ulcerative): Secondary | ICD-10-CM | POA: Diagnosis not present

## 2024-07-13 DIAGNOSIS — H04561 Stenosis of right lacrimal punctum: Secondary | ICD-10-CM | POA: Diagnosis not present

## 2024-07-13 DIAGNOSIS — H04221 Epiphora due to insufficient drainage, right lacrimal gland: Secondary | ICD-10-CM | POA: Diagnosis not present

## 2024-07-16 LAB — SURGICAL PATHOLOGY

## 2024-07-22 DIAGNOSIS — H04553 Acquired stenosis of bilateral nasolacrimal duct: Secondary | ICD-10-CM | POA: Diagnosis not present

## 2024-07-29 DIAGNOSIS — H04222 Epiphora due to insufficient drainage, left lacrimal gland: Secondary | ICD-10-CM | POA: Diagnosis not present

## 2024-07-29 DIAGNOSIS — Z4889 Encounter for other specified surgical aftercare: Secondary | ICD-10-CM | POA: Diagnosis not present

## 2024-07-29 DIAGNOSIS — H04553 Acquired stenosis of bilateral nasolacrimal duct: Secondary | ICD-10-CM | POA: Diagnosis not present

## 2024-07-29 DIAGNOSIS — H0279 Other degenerative disorders of eyelid and periocular area: Secondary | ICD-10-CM | POA: Diagnosis not present

## 2024-07-30 DIAGNOSIS — M47816 Spondylosis without myelopathy or radiculopathy, lumbar region: Secondary | ICD-10-CM | POA: Diagnosis not present

## 2024-08-11 ENCOUNTER — Other Ambulatory Visit: Payer: Self-pay | Admitting: Allergy and Immunology

## 2024-08-11 ENCOUNTER — Other Ambulatory Visit: Payer: Self-pay | Admitting: *Deleted

## 2024-08-11 MED ORDER — DOXYCYCLINE HYCLATE 100 MG PO TABS: ORAL_TABLET | ORAL | 2 refills | Status: AC

## 2024-08-31 DIAGNOSIS — M47816 Spondylosis without myelopathy or radiculopathy, lumbar region: Secondary | ICD-10-CM | POA: Diagnosis not present

## 2024-09-08 DIAGNOSIS — Z09 Encounter for follow-up examination after completed treatment for conditions other than malignant neoplasm: Secondary | ICD-10-CM | POA: Diagnosis not present

## 2024-09-08 DIAGNOSIS — H04222 Epiphora due to insufficient drainage, left lacrimal gland: Secondary | ICD-10-CM | POA: Diagnosis not present

## 2024-09-08 DIAGNOSIS — H04553 Acquired stenosis of bilateral nasolacrimal duct: Secondary | ICD-10-CM | POA: Diagnosis not present

## 2024-09-08 DIAGNOSIS — H0279 Other degenerative disorders of eyelid and periocular area: Secondary | ICD-10-CM | POA: Diagnosis not present

## 2025-06-28 ENCOUNTER — Ambulatory Visit: Admitting: Allergy and Immunology
